# Patient Record
Sex: Female | Born: 1955
Health system: Southern US, Community
[De-identification: ages and names within clinical notes are randomized; demographics above are authoritative.]

## PROBLEM LIST (undated history)

## (undated) DIAGNOSIS — L509 Urticaria, unspecified: Secondary | ICD-10-CM

## (undated) DIAGNOSIS — K219 Gastro-esophageal reflux disease without esophagitis: Secondary | ICD-10-CM

## (undated) DIAGNOSIS — T783XXA Angioneurotic edema, initial encounter: Secondary | ICD-10-CM

## (undated) DIAGNOSIS — E7800 Pure hypercholesterolemia, unspecified: Secondary | ICD-10-CM

## (undated) DIAGNOSIS — E119 Type 2 diabetes mellitus without complications: Secondary | ICD-10-CM

## (undated) DIAGNOSIS — I10 Essential (primary) hypertension: Secondary | ICD-10-CM

## (undated) HISTORY — PX: TUBAL LIGATION: SHX77

## (undated) HISTORY — DX: Urticaria, unspecified: L50.9

## (undated) HISTORY — DX: Angioneurotic edema, initial encounter: T78.3XXA

## (undated) HISTORY — DX: Type 2 diabetes mellitus without complications: E11.9

## (undated) HISTORY — DX: Pure hypercholesterolemia, unspecified: E78.00

## (undated) HISTORY — DX: Gastro-esophageal reflux disease without esophagitis: K21.9

---

## 2015-05-17 ENCOUNTER — Encounter (INDEPENDENT_AMBULATORY_CARE_PROVIDER_SITE_OTHER): Payer: Self-pay | Admitting: *Deleted

## 2015-05-28 ENCOUNTER — Other Ambulatory Visit (INDEPENDENT_AMBULATORY_CARE_PROVIDER_SITE_OTHER): Payer: Self-pay | Admitting: Internal Medicine

## 2015-05-28 ENCOUNTER — Encounter (INDEPENDENT_AMBULATORY_CARE_PROVIDER_SITE_OTHER): Payer: Self-pay | Admitting: *Deleted

## 2015-05-28 ENCOUNTER — Ambulatory Visit (INDEPENDENT_AMBULATORY_CARE_PROVIDER_SITE_OTHER): Payer: BC Managed Care – PPO | Admitting: Internal Medicine

## 2015-05-28 ENCOUNTER — Encounter (INDEPENDENT_AMBULATORY_CARE_PROVIDER_SITE_OTHER): Payer: Self-pay | Admitting: Internal Medicine

## 2015-05-28 DIAGNOSIS — R1310 Dysphagia, unspecified: Secondary | ICD-10-CM

## 2015-05-28 DIAGNOSIS — E119 Type 2 diabetes mellitus without complications: Secondary | ICD-10-CM | POA: Insufficient documentation

## 2015-05-28 DIAGNOSIS — E7800 Pure hypercholesterolemia, unspecified: Secondary | ICD-10-CM | POA: Insufficient documentation

## 2015-05-28 NOTE — Progress Notes (Addendum)
   Subjective:    Patient ID: Julie Hamilton, female    DOB: [birth year], 59 y.o.   MRN: [id number]  HPI Referred by Dr. Pleas Koch for dysphagia. She tells me occasionally if she doesn't make a conscious effort to chew beef, it will lodge. Sometimes she has to vomit the bolus back up. If she chews it well enough, she doesn't have any issues. Chicken and beef bother her. She does have some acid reflux.  Acid reflux is worse at night. She ha taken Prilosec, Prevacid in the past. She does not take long-term. No abdominal pain. Usually has BM daily. No melena or BRRB. No family hx of colon cancer. Last colonoscopy 1 year ago by Dr Anthony Sar and was normal per patient.     Review of Systems Past Medical History  Diagnosis Date  . High cholesterol   . Diabetes ([place])     x 4 yrs prediabetic    Past Surgical History  Procedure Laterality Date  . Tubal ligation      Allergies  Allergen Reactions  . Penicillins     hives   Retired Agricultural engineer. No current outpatient prescriptions on file prior to visit.   No current facility-administered medications on file prior to visit.   Current Outpatient Prescriptions  Medication Sig Dispense Refill  . aspirin 81 MG tablet Take 81 mg by mouth daily.    . Calcium-Vitamin D (CALTRATE 600 PLUS-VIT D PO) Take by mouth.    . famotidine (PEPCID AC) 10 MG chewable tablet Chew 10 mg by mouth 2 (two) times daily.    Marland Kitchen ibuprofen (ADVIL,MOTRIN) 200 MG tablet Take 200 mg by mouth every 6 (six) hours as needed.    . Loratadine 10 MG CAPS Take by mouth every morning.    . lovastatin (MEVACOR) 20 MG tablet Take 20 mg by mouth at bedtime.    . metFORMIN (GLUCOPHAGE) 1000 MG tablet Take 1,000 mg by mouth 2 (two) times daily with a meal.    . Misc Natural Products (OSTEO BI-FLEX ADV DOUBLE ST PO) Take by mouth every morning.    . multivitamin-iron-minerals-folic acid (CENTRUM) chewable tablet Chew 1 tablet by mouth daily.     No current  facility-administered medications for this visit.         Objective:   Physical ExamBlood pressure 152/80, pulse 76, temperature 98.7 F (37.1 C), height 5' 5.5" (1.664 m), weight 177 lb 1.6 oz (80.332 kg). Alert and oriented. Skin warm and dry. Oral mucosa is moist.   . Sclera anicteric, conjunctivae is pink. Thyroid not enlarged. No cervical lymphadenopathy. Lungs clear. Heart regular rate and rhythm.  Abdomen is soft. Bowel sounds are positive. No hepatomegaly. No abdominal masses felt. No tenderness.  No edema to lower extremities.          Assessment & Plan:  EGD/ED. The risks and benefits such as perforation, bleeding, and infection were reviewed with the patient and is agreeable.

## 2015-05-28 NOTE — Patient Instructions (Addendum)
EGD/ED. The risks and benefits such as perforation, bleeding, and infection were reviewed with the patient and is agreeable.  Raise HOB 30 degrees. Take Pepcid 30 minutes before breakfast

## 2015-05-30 ENCOUNTER — Encounter (HOSPITAL_COMMUNITY): Payer: Self-pay | Admitting: *Deleted

## 2015-05-30 ENCOUNTER — Ambulatory Visit (HOSPITAL_COMMUNITY)
Admission: RE | Admit: 2015-05-30 | Discharge: 2015-05-30 | Disposition: A | Discharge status: Home / Self Care | Payer: BC Managed Care – PPO | Source: Ambulatory Visit | Attending: Internal Medicine | Admitting: Internal Medicine

## 2015-05-30 ENCOUNTER — Encounter (HOSPITAL_COMMUNITY)
Admission: RE | Disposition: A | Discharge status: Home / Self Care | Payer: Self-pay | Source: Ambulatory Visit | Attending: Internal Medicine

## 2015-05-30 DIAGNOSIS — Z7982 Long term (current) use of aspirin: Secondary | ICD-10-CM | POA: Insufficient documentation

## 2015-05-30 DIAGNOSIS — K210 Gastro-esophageal reflux disease with esophagitis: Secondary | ICD-10-CM | POA: Diagnosis not present

## 2015-05-30 DIAGNOSIS — K259 Gastric ulcer, unspecified as acute or chronic, without hemorrhage or perforation: Secondary | ICD-10-CM | POA: Diagnosis not present

## 2015-05-30 DIAGNOSIS — I10 Essential (primary) hypertension: Secondary | ICD-10-CM | POA: Diagnosis not present

## 2015-05-30 DIAGNOSIS — E119 Type 2 diabetes mellitus without complications: Secondary | ICD-10-CM | POA: Diagnosis not present

## 2015-05-30 DIAGNOSIS — Z7984 Long term (current) use of oral hypoglycemic drugs: Secondary | ICD-10-CM | POA: Diagnosis not present

## 2015-05-30 DIAGNOSIS — K449 Diaphragmatic hernia without obstruction or gangrene: Secondary | ICD-10-CM | POA: Diagnosis not present

## 2015-05-30 DIAGNOSIS — K222 Esophageal obstruction: Secondary | ICD-10-CM | POA: Diagnosis not present

## 2015-05-30 DIAGNOSIS — K255 Chronic or unspecified gastric ulcer with perforation: Secondary | ICD-10-CM | POA: Diagnosis not present

## 2015-05-30 DIAGNOSIS — D131 Benign neoplasm of stomach: Secondary | ICD-10-CM

## 2015-05-30 DIAGNOSIS — E7800 Pure hypercholesterolemia, unspecified: Secondary | ICD-10-CM | POA: Insufficient documentation

## 2015-05-30 DIAGNOSIS — K317 Polyp of stomach and duodenum: Secondary | ICD-10-CM | POA: Diagnosis not present

## 2015-05-30 DIAGNOSIS — R1310 Dysphagia, unspecified: Secondary | ICD-10-CM | POA: Insufficient documentation

## 2015-05-30 DIAGNOSIS — K2970 Gastritis, unspecified, without bleeding: Secondary | ICD-10-CM | POA: Insufficient documentation

## 2015-05-30 HISTORY — PX: ESOPHAGOGASTRODUODENOSCOPY: SHX5428

## 2015-05-30 HISTORY — DX: Essential (primary) hypertension: I10

## 2015-05-30 HISTORY — PX: ESOPHAGEAL DILATION: SHX303

## 2015-05-30 LAB — GLUCOSE, CAPILLARY: Glucose-Capillary: 93 mg/dL (ref 65–99)

## 2015-05-30 SURGERY — EGD (ESOPHAGOGASTRODUODENOSCOPY)
Anesthesia: Moderate Sedation

## 2015-05-30 MED ORDER — SODIUM CHLORIDE 0.9 % IV SOLN
INTRAVENOUS | Status: DC
  Administered 2015-05-30: 10:00:00 via INTRAVENOUS

## 2015-05-30 MED ORDER — BUTAMBEN-TETRACAINE-BENZOCAINE 2-2-14 % EX AERO
INHALATION_SPRAY | CUTANEOUS | Status: DC | PRN
  Administered 2015-05-30: 2 via TOPICAL

## 2015-05-30 MED ORDER — MIDAZOLAM HCL 5 MG/5ML IJ SOLN
INTRAMUSCULAR | Status: AC
  Filled 2015-05-30: qty 10

## 2015-05-30 MED ORDER — MEPERIDINE HCL 50 MG/ML IJ SOLN
INTRAMUSCULAR | Status: AC
  Filled 2015-05-30: qty 1

## 2015-05-30 MED ORDER — STERILE WATER FOR IRRIGATION IR SOLN
Status: DC | PRN
Start: 2015-05-30 — End: 2015-05-30
  Administered 2015-05-30: 11:00:00

## 2015-05-30 MED ORDER — MEPERIDINE HCL 50 MG/ML IJ SOLN
INTRAMUSCULAR | Status: DC | PRN
Start: 2015-05-30 — End: 2015-05-30
  Administered 2015-05-30 (×2): 25 mg via INTRAVENOUS

## 2015-05-30 MED ORDER — BUTAMBEN-TETRACAINE-BENZOCAINE 2-2-14 % EX AERO
INHALATION_SPRAY | CUTANEOUS | Status: AC
  Filled 2015-05-30: qty 20

## 2015-05-30 MED ORDER — PANTOPRAZOLE SODIUM 40 MG PO TBEC: 40.0000 mg | DELAYED_RELEASE_TABLET | Freq: Every day | ORAL | Status: DC

## 2015-05-30 MED ORDER — MIDAZOLAM HCL 5 MG/5ML IJ SOLN
INTRAMUSCULAR | Status: DC | PRN
  Administered 2015-05-30: 2 mg via INTRAVENOUS
  Administered 2015-05-30: 1 mg via INTRAVENOUS
  Administered 2015-05-30: 2 mg via INTRAVENOUS
  Administered 2015-05-30: 1 mg via INTRAVENOUS
  Administered 2015-05-30: 2 mg via INTRAVENOUS
  Administered 2015-05-30: 1 mg via INTRAVENOUS

## 2015-05-30 NOTE — H&P (Signed)
Julie Hamilton is an 59 y.o. female.   Chief Complaint: Patient is here for EGD and ED. HPI: Patient is 59 year old Caucasian female who presents with several month history of intermittent solid food dysphagia. She she points to suprasternal area upper miscellaneous site of bolus obstruction. She has intermittent heartburn but not every day. He uses OTC Pepcid usually twice a week. She denies nausea vomiting involuntary weight loss abdominal pain or melena. She's had a few episodes food impaction that she's been able to get relief by self-induced vomiting.  Past Medical History  Diagnosis Date  . High cholesterol   . Diabetes ([place])     x 4 yrs prediabetic  . Hypertension     Past Surgical History  Procedure Laterality Date  . Tubal ligation      History reviewed. No pertinent family history. Social History:  reports that she has never smoked. She does not have any smokeless tobacco history on file. She reports that she does not drink alcohol or use illicit drugs.  Allergies:  Allergies  Allergen Reactions  . Penicillins     hives    Medications Prior to Admission  Medication Sig Dispense Refill  . aspirin 81 MG tablet Take 81 mg by mouth daily.    . Calcium-Vitamin D (CALTRATE 600 PLUS-VIT D PO) Take by mouth.    . famotidine (PEPCID AC) 10 MG chewable tablet Chew 10 mg by mouth 2 (two) times daily.    Marland Kitchen ibuprofen (ADVIL,MOTRIN) 200 MG tablet Take 200 mg by mouth every 6 (six) hours as needed.    . Loratadine 10 MG CAPS Take by mouth every morning.    . lovastatin (MEVACOR) 20 MG tablet Take 20 mg by mouth at bedtime.    . metFORMIN (GLUCOPHAGE) 1000 MG tablet Take 1,000 mg by mouth 2 (two) times daily with a meal.    . Misc Natural Products (OSTEO BI-FLEX ADV DOUBLE ST PO) Take by mouth every morning.    . multivitamin-iron-minerals-folic acid (CENTRUM) chewable tablet Chew 1 tablet by mouth daily.      Results for orders placed or performed during the hospital encounter of  05/30/15 (from the past 48 hour(s))  Glucose, capillary     Status: None   Collection Time: 05/30/15 10:14 AM  Result Value Ref Range   Glucose-Capillary 93 65 - 99 mg/dL   No results found.  ROS  Blood pressure 151/102, pulse 92, temperature 97.8 F (36.6 C), temperature source Oral, resp. rate 17, height 5\' 5"  (1.651 m), weight 177 lb (80.287 kg), SpO2 99 %. Physical Exam  Constitutional: She appears well-developed and well-nourished.  HENT:  Mouth/Throat: Oropharynx is clear and moist.  Eyes: Conjunctivae are normal. No scleral icterus.  Neck: No thyromegaly present.  Cardiovascular: Normal rate, regular rhythm and normal heart sounds.   No murmur heard. Respiratory: Effort normal and breath sounds normal.  GI: Soft. She exhibits no distension and no mass. There is no tenderness.  Musculoskeletal: She exhibits no edema.  Lymphadenopathy:    She has no cervical adenopathy.  Neurological: She is alert.  Skin: Skin is warm and dry.     Assessment/Plan Solid food dysphagia. EGD with ED.  Jered Heiny U 05/30/2015, 10:29 AM

## 2015-05-30 NOTE — Discharge Instructions (Signed)
Resume aspirin on 06/02/2015. Keep ibuprofen use to minimum. Resume other medications and usual diet. No driving for 24 hours. Physician will call with biopsy results. Office visit in 3 months.  Esophagogastroduodenoscopy Esophagogastroduodenoscopy (EGD) is a procedure that is used to examine the lining of the esophagus, stomach, and first part of the small intestine (duodenum). A long, flexible, lighted tube with a camera attached (endoscope) is inserted down the throat to view these organs. This procedure is done to detect problems or abnormalities, such as inflammation, bleeding, ulcers, or growths, in order to treat them. The procedure lasts 5-20 minutes. It is usually an outpatient procedure, but it may need to be performed in a hospital in emergency cases. LET [facility] CARE PROVIDER KNOW ABOUT:  Any allergies you have.  All medicines you are taking, including vitamins, herbs, eye drops, creams, and over-the-counter medicines.  Previous problems you or members of your family have had with the use of anesthetics.  Any blood disorders you have.  Previous surgeries you have had.  Medical conditions you have. RISKS AND COMPLICATIONS Generally, this is a safe procedure. However, problems can occur and include:  Infection.  Bleeding.  Tearing (perforation) of the esophagus, stomach, or duodenum.  Difficulty breathing or not being able to breathe.  Excessive sweating.  Spasms of the larynx.  Slowed heartbeat.  Low blood pressure. BEFORE THE PROCEDURE  Do not eat or drink anything after midnight on the night before the procedure or as directed by your health care provider.  Do not take your regular medicines before the procedure if your health care provider asks you not to. Ask your health care provider about changing or stopping those medicines.  If you wear dentures, be prepared to remove them before the procedure.  Arrange for someone to drive you home after the  procedure. PROCEDURE  A numbing medicine (local anesthetic) may be sprayed in your throat for comfort and to stop you from gagging or coughing.  You will have an IV tube inserted in a vein in your hand or arm. You will receive medicines and fluids through this tube.  You will be given a medicine to relax you (sedative).  A pain reliever will be given through the IV tube.  A mouth guard may be placed in your mouth to protect your teeth and to keep you from biting on the endoscope.  You will be asked to lie on your left side.  The endoscope will be inserted down your throat and into your esophagus, stomach, and duodenum.  Air will be put through the endoscope to allow your health care provider to clearly view the lining of your esophagus.  The lining of your esophagus, stomach, and duodenum will be examined. During the exam, your health care provider may:  Remove tissue to be examined under a microscope (biopsy) for inflammation, infection, or other medical problems.  Remove growths.  Remove objects (foreign bodies) that are stuck.  Treat any bleeding with medicines or other devices that stop tissues from bleeding (hot cautery, clipping devices).  Widen (dilate) or stretch narrowed areas of your esophagus and stomach.  The endoscope will be withdrawn. AFTER THE PROCEDURE  You will be taken to a recovery area for observation. Your blood pressure, heart rate, breathing rate, and blood oxygen level will be monitored often until the medicines you were given have worn off.  Do not eat or drink anything until the numbing medicine has worn off and your gag reflex has returned. You may  choke.  Your health care provider should be able to discuss his or her findings with you. It will take longer to discuss the test results if any biopsies were taken.   This information is not intended to replace advice given to you by your health care provider. Make sure you discuss any questions you have  with your health care provider.   Document Released: 10/03/2004 Document Revised: 06/23/2014 Document Reviewed: 05/05/2012 Elsevier Interactive Patient Education [employer].

## 2015-05-30 NOTE — Op Note (Addendum)
EGD PROCEDURE REPORT  PATIENT:  Julie Hamilton  MR#:  [id number] Birthdate:  1955-11-30, 59 y.o., female Endoscopist:  Dr. Rogene Houston, MD Referred By:  Dr. [name], MD  Procedure Date: 05/30/2015  Procedure:   EGD with ED  Indications: Patient is 59 year old Caucasian female who presents with several month history of intermittent solid food dysphagia. She has had few episodes of food impaction relief with self-induced vomiting. She has history of intermittent heartburn and presently on H2B on as-needed basis per            Informed Consent:  The risks, benefits, alternatives & imponderables which include, but are not limited to, bleeding, infection, perforation, drug reaction and potential missed lesion have been reviewed.  The potential for biopsy, lesion removal, esophageal dilation, etc. have also been discussed.  Questions have been answered.  All parties agreeable.  Please see history & physical in medical record for more information.  Medications:  Demerol 50 mg IV Versed 9 mg IV Cetacaine spray topically for oropharyngeal anesthesia  Description of procedure:  The endoscope was introduced through the mouth and advanced to the second portion of the duodenum without difficulty or limitations. The mucosal surfaces were surveyed very carefully during advancement of the scope and upon withdrawal.  Findings:  Esophagus:  Mucosa of the esophagus was normal. 2 erosions noted at GE junction along with stricture. Gastric mucosa at GE junction was erythematous and edematous. GEJ:  35 cm Hiatus:  38 cm Stomach:  Stomach was empty and distended very well with insufflation. Folds in the proximal stomach were normal. Examination of mucosa at gastric body revealed 5 mm ulcer along the posterior wall covered with eschar or coffee-ground material. No active bleeding noted. Antral mucosa was normal. Pyloric channel was patent. 5 mm hyperplastic appearing polyps noted just below the level of  hiatus also seen on forward view. Duodenum:  Patchy bulbar erythema and edema. Post bulbar mucosa was normal.  Therapeutic/Diagnostic Maneuvers Performed:   Esophagus dilated by passing 36 French Maloney dilator to full insertion. Esophageal mucosa was reexamined post dilation and mucosal disruption noted at GE junction. Three biopsies were taken from gastric ulcer and mucosa around it.  Complications:  None  EBL: Minimal  Impression: Erosive esophagitis along with stricture at GE junction. Small sliding hiatal hernia. 5 mm ulcer at gastric body along the posterior wall. Biopsies taken. 5 mm hyperplastic appearing polyp just below the level of hiatus. This polyp was left alone. Esophagus dilated by passing 54 French Maloney dilator resulting in mucosal disruption at [employer].  Recommendations:  Standard instructions given. Patient will resume aspirin in 06/02/2015. Discontinue famotidine. Pantoprazole 40 mg by mouth every morning. I will be contacting patient with biopsy results. Office visit in 3 months.  Julie Hamilton  05/30/2015  11:01 AM  CC: Dr. [name], MD & Dr. Rayne Du ref. provider found

## 2015-06-04 ENCOUNTER — Encounter (HOSPITAL_COMMUNITY): Payer: Self-pay | Admitting: Internal Medicine

## 2015-08-28 ENCOUNTER — Encounter (INDEPENDENT_AMBULATORY_CARE_PROVIDER_SITE_OTHER): Payer: Self-pay | Admitting: Internal Medicine

## 2015-08-28 ENCOUNTER — Ambulatory Visit (INDEPENDENT_AMBULATORY_CARE_PROVIDER_SITE_OTHER): Payer: BC Managed Care – PPO | Admitting: Internal Medicine

## 2015-08-28 VITALS — BP 142/80 | HR 72 | Temp 97.9°F | Ht 65.5 in | Wt 178.4 lb

## 2015-08-28 DIAGNOSIS — R1319 Other dysphagia: Secondary | ICD-10-CM

## 2015-08-28 DIAGNOSIS — K219 Gastro-esophageal reflux disease without esophagitis: Secondary | ICD-10-CM | POA: Diagnosis not present

## 2015-08-28 MED ORDER — PANTOPRAZOLE SODIUM 40 MG PO TBEC: 40.0000 mg | DELAYED_RELEASE_TABLET | Freq: Every day | ORAL | Status: DC

## 2015-08-28 NOTE — Progress Notes (Signed)
Subjective:    Patient ID: Julie Hamilton, female    DOB: [birth year], 60 y.o.   MRN: [id number]  HPIHere today for f/u after EGD/ED in December for intermittent solid food dysphagia. She tells me she is doing good. GERD controlled with Protonix. No dysphagia. Underwent an EGD/ED in December. Appetite is good. No weight loss.  Has a BM daily. No melena or BRRB.   05/30/2015 EGD/ED:  Indications: Patient is 60 year old Caucasian female who presents with several month history of intermittent solid food dysphagia. She has had few episodes of food impaction relief with self-induced vomiting. She has history of intermittent heartburn and presently on H2B on as-needed basis per Impression: Erosive esophagitis along with stricture at GE junction. Small sliding hiatal hernia. 5 mm ulcer at gastric body along the posterior wall. Biopsies taken. 5 mm hyperplastic appearing polyp just below the level of hiatus. This polyp was left alone. Esophagus dilated by passing 54 French Maloney dilator resulting in mucosal disruption at [employer].  Gastric biopsy shows gastritis but no ulcer and no evidence of H. Pylori. Patient is able to swallow much better and not having heartburn anymore.  Review of Systems Past Medical History  Diagnosis Date  . High cholesterol   . Diabetes ([place])     x 4 yrs prediabetic  . Hypertension   . GERD (gastroesophageal reflux disease)     Past Surgical History  Procedure Laterality Date  . Tubal ligation    . Esophagogastroduodenoscopy N/A 05/30/2015    Procedure: ESOPHAGOGASTRODUODENOSCOPY (EGD);  Surgeon: Rogene Houston, MD;  Location: [facility];  Service: Endoscopy;  Laterality: N/A;  10:30  . Esophageal dilation Bilateral 05/30/2015    Procedure: ESOPHAGEAL DILATION;  Surgeon: Rogene Houston, MD;  Location: [facility];  Service: Endoscopy;   Laterality: Bilateral;    Allergies  Allergen Reactions  . Penicillins     hives    Current Outpatient Prescriptions on File Prior to Visit  Medication Sig Dispense Refill  . aspirin 81 MG tablet Take 81 mg by mouth. One every other day    . Calcium-Vitamin D (CALTRATE 600 PLUS-VIT D PO) Take by mouth.    Marland Kitchen ibuprofen (ADVIL,MOTRIN) 200 MG tablet Take 200 mg by mouth every 6 (six) hours as needed.    . Loratadine 10 MG CAPS Take by mouth every morning.    . lovastatin (MEVACOR) 20 MG tablet Take 20 mg by mouth at bedtime.    . metFORMIN (GLUCOPHAGE) 1000 MG tablet Take 500 mg by mouth daily with breakfast.     . Misc Natural Products (OSTEO BI-FLEX ADV DOUBLE ST PO) Take by mouth every morning.    . multivitamin-iron-minerals-folic acid (CENTRUM) chewable tablet Chew 1 tablet by mouth daily.     No current facility-administered medications on file prior to visit.        Objective:   Physical ExamBlood pressure 142/80, pulse 72, temperature 97.9 F (36.6 C), height 5' 5.5" (1.664 m), weight 178 lb 6.4 oz (80.922 kg).  Alert and oriented. Skin warm and dry. Oral mucosa is moist.   . Sclera anicteric, conjunctivae is pink. Thyroid not enlarged. No cervical lymphadenopathy. Lungs clear. Heart regular rate and rhythm.  Abdomen is soft. Bowel sounds are positive. No hepatomegaly. No abdominal masses felt. No tenderness.  No edema to lower extremities.         Assessment & Plan:  GERD controlled with Protonix. Continue. Refill sent to her pharmacy. Dysphagia: No problems swallowing.  OV in 1 year.

## 2015-08-28 NOTE — Patient Instructions (Signed)
OV in 1 year. Continue the Protonix 

## 2016-01-22 ENCOUNTER — Encounter: Payer: Self-pay | Admitting: Allergy & Immunology

## 2016-01-22 ENCOUNTER — Ambulatory Visit (INDEPENDENT_AMBULATORY_CARE_PROVIDER_SITE_OTHER): Payer: BC Managed Care – PPO | Admitting: Allergy & Immunology

## 2016-01-22 VITALS — BP 126/70 | HR 99 | Temp 97.4°F | Resp 16 | Ht 64.17 in | Wt 178.6 lb

## 2016-01-22 DIAGNOSIS — L500 Allergic urticaria: Secondary | ICD-10-CM

## 2016-01-22 DIAGNOSIS — L508 Other urticaria: Secondary | ICD-10-CM | POA: Insufficient documentation

## 2016-01-22 NOTE — Patient Instructions (Addendum)
1. Chronic urticaria - Testing today showed: positive to milk, casein, cashew, strawberry and almond  - Try taking each food out for 2-4 weeks at a time to see if they get better.  - We will get some lab work to rule out more serious causes of hives: complete blood count, tryptase, ESR (inflammatory marker), CRP (inflammatory marker), chronic urticaria panel (to look for auto-antibodies against mast cells which can release hive-causing chemicals). - Start cetirizine 68m in the morning and 243mat night. You can increase as needed at home to control the hives up to 3068mwice daily. - If this is not working, give us Koreacall!  - We can move to other classes of medications if this is not working.  2. Return to clinic in two months.  It was a pleasure to meet you today!

## 2016-01-22 NOTE — Progress Notes (Signed)
NEW PATIENT  Date of Service/Encounter:  01/22/16   Assessment:   Chronic urticaria - Plan: CP Chronic Urticaria index panel, C-reactive protein, Sedimentation rate, Tryptase, CBC with Differential, Allergy Test    Plan/Recommendations:    1. Chronic urticaria - Testing today showed: positive to milk, casein, cashew, strawberry and almond  - We did discuss the above food allergy testing, including its limitations. There is a high false positive rate. Therefore the positives that we got on testing today may not be valid. Therefore we will only remove the foods for a limited time.  - She was not having anaphylaxis symptoms to the foods, therefore no epinephrine prescribed.  - Try taking each food out for 2-4 weeks at a time to see if the hives improve.  - If there is no improvement, add the foods back to your diet.  - We will get some lab work to rule out more serious causes of hives: complete blood count, tryptase, ESR (inflammatory marker), CRP (inflammatory marker), chronic urticaria panel (to look for auto-antibodies against mast cells which can release hive-causing chemicals). - Start cetirizine 69m in the morning and 293mat night. You can increase as needed at home to control the hives up to 3055mwice daily. - If this is not working, give us Koreacall!  - We can move to other classes of medications if this is not working.  2. Return to clinic in two months.      Subjective:   Julie Hamilton a 60 3o. female presenting today for evaluation of  Chief Complaint  Patient presents with  . Asthma  .  Julie Nixs a history of the following: Patient Active Problem List   Diagnosis Date Noted  . Diabetes ([place]/05/2015  . High cholesterol 05/28/2015  . Dysphagia 05/28/2015    History obtained from: chart review and patient.  Julie Hamilton referred by BURCurlene LabrumD.     Julie Hamilton a 60 53o. female presenting for hives. Patient reports that  these have been going on for several months. Patient reports that the worst episode occurs on June 28th. She started working at [employer]. 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 Then she noticed that she was tired. Shortly after waking up from a nap, she reports that she had "firey" hands that were pruritic. Symptoms continued for three days. She treated with antihistamines and ointments. She had eaten peanut butter earlier in the day. She had no other systemic symptoms, including wheezing, abdominal pain, throat closure, etc since that time. Since starting at the volunteering gig, she has had small bug bites as well.   Since that time, she has been keeping a food diary. Cashews may be a trigger. She also had shrimp earlier this year in January or February. She ate shrimp twice in one day and this resulted in difficulty breathing and talking. She did not have hives, stomach pain, vomiting, etc. She endorses swelling of her lip or blisters on her mouth. The lesions tend to resolve to her normal skin. She has had no fevers or joint She has not had blood work done since the symptoms started but next Tuesday she is going in for lab work for her PCP. She did change her laundry soap yesterday but otherwise no changes.   The urticaria tend to occur on her back around her bra straps,  under arms, and on her thighs. Another trigger is when she has pressure against her skin (bra, bags of groceries). However, this is not always the case. She has noticed a worsening of her symptoms with exposure to warm water, but water in general does not make them appear. Sunshine and vibrations are not a trigger either. She is on loratadine which does relieve some of the itching. She also takes a generic diphenhydramine. She has never taken any other antihistamine. She has never had any systemic reactions during these outbreaks,  including abdominal pain, wheezing, throat swelling, vomiting, or passing out.   Julie Hamilton does have a history of allergic rhinitis and was tested at our office in 2005. Testing at the time demonstrated positives to grasses, weeds, trees and equivocal results to dust mites. She also had food testing that showed equivocal results to shellfish, fish, and cashew. However she did not avoid any foods following that visit in 2005 and never followed up again   Julie Hamilton had hypertension and was on blood pressure medications until recently. She also has a history of diabetes. Otherwise, there is no history of other atopic diseases, including asthma, drug allergies, or inging insect allergies. There is no significant infectious history. Vaccinations are up to date.    Past Medical History: Patient Active Problem List   Diagnosis Date Noted  . Diabetes ([place]) 05/28/2015  . High cholesterol 05/28/2015  . Dysphagia 05/28/2015    Medication List:    Medication List       Accurate as of 01/22/16  5:01 PM. Always use your most recent med list.          aspirin 81 MG tablet Take 81 mg by mouth. One every other day   CALTRATE 600 PLUS-VIT D PO Take by mouth.   ibuprofen 200 MG tablet Commonly known as:  ADVIL,MOTRIN Take 200 mg by mouth every 6 (six) hours as needed.   Loratadine 10 MG Caps Take by mouth every morning.   lovastatin 20 MG tablet Commonly known as:  MEVACOR Take 20 mg by mouth at bedtime.   metFORMIN 500 MG tablet Commonly known as:  GLUCOPHAGE   multivitamin-iron-minerals-folic acid chewable tablet Chew 1 tablet by mouth daily.   OSTEO BI-FLEX ADV DOUBLE ST PO Take by mouth every morning.   pantoprazole 40 MG tablet Commonly known as:  PROTONIX Take 1 tablet (40 mg total) by mouth daily.       Birth History: non-contributory  Developmental History: Julie Hamilton has met all milestones on time.   Past Surgical History: Past Surgical History:  Procedure Laterality Date   . ESOPHAGEAL DILATION Bilateral 05/30/2015   Procedure: ESOPHAGEAL DILATION;  Surgeon: Rogene Houston, MD;  [facility];  Service: Endoscopy;  Laterality: Bilateral;  . ESOPHAGOGASTRODUODENOSCOPY N/A 05/30/2015   Procedure: ESOPHAGOGASTRODUODENOSCOPY (EGD);  Surgeon: Rogene Houston, MD;  [facility];  Service: Endoscopy;  Laterality: N/A;  10:30  . TUBAL LIGATION       Family History: Family History  Problem Relation Age of Onset  . 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 There are no pets at home and no smoke. She lives with her husband and three grandchildren and your daughter-in-law.    Review of Systems: a 14-point review of systems is pertinent for what is mentioned in HPI.  Otherwise, all other systems were negative. Constitutional: negative other  than that listed in the HPI Eyes: negative other than that listed in the HPI Ears, nose, mouth, throat, and face: negative other than that listed in the HPI Respiratory: negative other than that listed in the HPI Cardiovascular: negative other than that listed in the HPI Gastrointestinal: negative other than that listed in the HPI Genitourinary: negative other than that listed in the HPI Integument: negative other than that listed in the HPI Hematologic: negative other than that listed in the HPI Musculoskeletal: negative other than that listed in the HPI Neurological: negative other than that listed in the HPI Allergy/Immunologic: negative other than that listed in the HPI    Objective:   Blood pressure 126/70, pulse 99, temperature 97.4 F (36.3 C), temperature source Oral, resp. rate 16, height 5' 4.17" (1.63 m), weight 178 lb 9.6 oz (81 kg), SpO2 98 %. Body mass index is 30.49 kg/m.  Physical Exam:  General: Alert, interactive, in no acute distress. Talkative female. Cooperative with exam. HEENT: TMs pearly  gray, turbinates edematous with clear discharge, post-pharynx mildly erythematous. Neck: Supple without lymphadenopathy. Lungs: Clear to auscultation without wheezing, rhonchi or rales. No crackles. Moving air well in all lung fields. Increased work of breathing. CV: Normal S1, S2 without murmurs. Capillary refill within normal limits. Abdomen: Nondistended, nontender. No masses. Skin:Urticarial lesions noted on her abdomen and inner thigh. These lesions are blanchable.. Extremities: No clubbing, cyanosis or edema. Neuro: Grossly intact. No focal deficits.  Diagnostic studies:  Allergy testing to selected foods: Positive to milk, casein, cashew, strawberry, almond with adequate controls. Overall, she was dermatographic.   Salvatore Marvel, MD [facility]

## 2016-02-25 ENCOUNTER — Encounter (INDEPENDENT_AMBULATORY_CARE_PROVIDER_SITE_OTHER): Payer: Self-pay

## 2016-04-01 ENCOUNTER — Encounter: Payer: Self-pay | Admitting: Allergy & Immunology

## 2016-04-01 ENCOUNTER — Ambulatory Visit (INDEPENDENT_AMBULATORY_CARE_PROVIDER_SITE_OTHER): Payer: BC Managed Care – PPO | Admitting: Allergy & Immunology

## 2016-04-01 VITALS — BP 120/70 | HR 82 | Temp 97.6°F | Resp 16

## 2016-04-01 DIAGNOSIS — T781XXD Other adverse food reactions, not elsewhere classified, subsequent encounter: Secondary | ICD-10-CM | POA: Diagnosis not present

## 2016-04-01 DIAGNOSIS — L500 Allergic urticaria: Secondary | ICD-10-CM | POA: Diagnosis not present

## 2016-04-01 NOTE — Patient Instructions (Addendum)
1. Allergic urticaria with multiple food allergies - Continue to avoid milk, casein, cashew, strawberry and almond  - Continue with suppressive dosing of cetirizine: 10mg  twice daily (can decrease as tolerated if you would like). - If you are having too much sleepiness, you can change to Allegra (fexofenadine) - Watch for protein amounts in your coconut milk and any other milk substitutes.  - I will look into the relationship between strawberry and raspberry and get back in touch with you.  2. Return to clinic in nine months.  It was a pleasure to see you again today! Thank you for everything that you do!   Websites that have reliable patient information: 1. American Academy of Asthma, Allergy, and Immunology: www.aaaai.org 2. Food Allergy Research and Education (FARE): foodallergy.org 3. Mothers of Asthmatics: http://www.asthmacommunitynetwork.org 4. American College of Allergy, Asthma, and Immunology: www.acaai.org

## 2016-04-01 NOTE — Progress Notes (Signed)
FOLLOW UP  Date of Service/Encounter:  04/01/16   Assessment:   Allergic urticaria  Adverse food reaction, subsequent encounter    Plan/Recommendations:   1. Allergic urticaria with multiple food allergies  - Continue to avoid milk, casein, cashew, strawberry and almond  - Continue with suppressive dosing of cetirizine: 10mg  twice daily (can decrease as tolerated if you would like). - If you are having too much sleepiness, you can change to Allegra (fexofenadine) - Watch for protein amounts in your coconut milk and any other milk substitutes.  - I did look into the relationship between strawberries and raspberries, and in fact there is no cross reactivity. - There is some potential cross reactivity between strawberries, grapes, and cherries; however she should continue to eat grapes and cherries if she is doing fine with them.   2. Return to clinic in nine months.   Subjective:   Julie Hamilton is a 60 y.o. female presenting today for follow up of  Chief Complaint  Patient presents with  . Urticaria  .  Julie Hamilton has a history of the following: Patient Active Problem List   Diagnosis Date Noted  . Chronic urticaria 01/22/2016  . Diabetes ([place]) 05/28/2015  . High cholesterol 05/28/2015  . Dysphagia 05/28/2015    History obtained from: chart review and patient.  Julie Hamilton was referred by Julie Labrum, MD.     Julie Hamilton is a 60 y.o. female presenting for a follow up visit. Julie Hamilton was last seen in August 2017 for her first visit. At that time, she did have a workup for urticaria including food testing that was positive to milk, casein, cashew, strawberry, and almond. She was not having anaphylactic symptoms to the foods, therefore no EpiPen was prescribed. I recommended that she remove those foods from her diet to see if the urticaria improved. We ordered a lab workup that never collected from what I can tell. I put her on high dose antihistamine (20mg   cetirizine BID).  Since the last visit, she has done well. She is on cetirizine 10mg  BID and her hives are well controlled on this regimen. She has her diary with her today with symptoms and foods. She does not think that she has had hives for around three weeks. She continues to avoid milk, strawberries, and almonds and cashews. She is using coconut milk as her substitute. She is using dark chocolate as well as sorbet for her treats. She is wondering today whether raspberries and strawberries are related since she had raspberry sorbet with a minor breakout shortly after seeing me last time. She is continuing to avoid only almonds and cashew but eats the other tree nuts without a problem. She has had no systemic reactions necessitating the use of epinephrine. Interestingly, her GI complaints (crampy abdominal pain and diarrhea) have resolved since stopping cow's milk. Prior to this, she felt that her symptoms were related to either metformin or Protonix. However she has continued to take these medications while adjusting to her new dietary restrictions.    Otherwise, there have been no changes to her past medical history, surgical history, family history, or social history. 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     Review of Systems: a 14-point review of systems is pertinent for what is mentioned in HPI.  Otherwise, all other systems were negative. Constitutional: negative other than that listed in the HPI Eyes: negative other than that listed in the  HPI Ears, nose, mouth, throat, and face: negative other than that listed in the HPI Respiratory: negative other than that listed in the HPI Cardiovascular: negative other than that listed in the HPI Gastrointestinal: negative other than that listed in the HPI Genitourinary: negative other than that listed in the HPI Integument: negative other than that listed in the HPI Hematologic: negative other than  that listed in the HPI Musculoskeletal: negative other than that listed in the HPI Neurological: negative other than that listed in the HPI Allergy/Immunologic: negative other than that listed in the HPI    Objective:   Blood pressure 120/70, pulse 82, temperature 97.6 F (36.4 C), temperature source Oral, resp. rate 16, SpO2 97 %. There is no height or weight on file to calculate BMI.   Physical Exam:  General: Alert, interactive, in no acute distress. Cooperative with the exam. Very pleasant and thankful female. HEENT: TMs pearly gray, turbinates edematous without discharge, post-pharynx mildly erythematous. Neck: Supple without thyromegaly. Adenopathy: no enlarged lymph nodes appreciated in the anterior cervical, occipital, axillary, epitrochlear, inguinal, or popliteal regions Lungs: Clear to auscultation without wheezing, rhonchi or rales. No increased work of breathing. CV: Normal S1/S2, no murmurs. Capillary refill <2 seconds.  Abdomen: Nondistended, nontender. No guarding or rebound tenderness. Bowel sounds present in all fields  Skin: Warm and dry, without lesions or rashes. Extremities:  No clubbing, cyanosis or edema. Neuro:   Grossly intact.  Diagnostic studies: None    Julie Marvel, MD [facility]

## 2016-06-25 ENCOUNTER — Encounter (INDEPENDENT_AMBULATORY_CARE_PROVIDER_SITE_OTHER): Payer: Self-pay

## 2016-06-25 ENCOUNTER — Encounter (INDEPENDENT_AMBULATORY_CARE_PROVIDER_SITE_OTHER): Payer: Self-pay | Admitting: Internal Medicine

## 2016-08-27 ENCOUNTER — Ambulatory Visit (INDEPENDENT_AMBULATORY_CARE_PROVIDER_SITE_OTHER): Payer: BC Managed Care – PPO | Admitting: Internal Medicine

## 2016-08-28 ENCOUNTER — Ambulatory Visit (INDEPENDENT_AMBULATORY_CARE_PROVIDER_SITE_OTHER): Payer: BC Managed Care – PPO | Admitting: Internal Medicine

## 2016-09-01 ENCOUNTER — Encounter (INDEPENDENT_AMBULATORY_CARE_PROVIDER_SITE_OTHER): Payer: Self-pay

## 2016-09-01 ENCOUNTER — Ambulatory Visit (INDEPENDENT_AMBULATORY_CARE_PROVIDER_SITE_OTHER): Payer: BC Managed Care – PPO | Admitting: Internal Medicine

## 2016-09-01 ENCOUNTER — Encounter (INDEPENDENT_AMBULATORY_CARE_PROVIDER_SITE_OTHER): Payer: Self-pay | Admitting: Internal Medicine

## 2016-09-01 VITALS — BP 142/80 | HR 80 | Temp 98.2°F | Ht 65.0 in | Wt 177.6 lb

## 2016-09-01 DIAGNOSIS — K219 Gastro-esophageal reflux disease without esophagitis: Secondary | ICD-10-CM | POA: Diagnosis not present

## 2016-09-01 NOTE — Progress Notes (Signed)
   Subjective:    Patient ID: Julie Hamilton, female    DOB: Mar 08, 1956, 61 y.o.   MRN: [id number]  HPIHere today for f/u. She was last seen 08/2015 for GERD. Underwent and EGD/ED 05/30/2015 for dysphagia. Impression: Erosive esophagitis along with stricture at GE junction. Small sliding hiatal hernia. 5 mm ulcer at gastric body along the posterior wall. Biopsies taken. 5 mm hyperplastic appearing polyp just below the level of hiatus. This polyp was left alone. Esophagus dilated by passing 54 French Maloney dilator resulting in mucosal disruption at [employer]. Gastric biopsy shows gastritis but no ulcer and no evidence of H. Pylori. Patient is able to swallow much better and not having heartburn anymore. She says her acid reflux is good. Protonix controls her symptoms. She rarely has dysphagia. Sometimes if sh eats a large piece.  She has a BM at least once a day and sometimes twice a day. No melena or BRRB.    Review of Systems Past Medical History:  Diagnosis Date  . Diabetes ([place])    x 4 yrs prediabetic  . GERD (gastroesophageal reflux disease)   . High cholesterol   . Hypertension     Past Surgical History:  Procedure Laterality Date  . ESOPHAGEAL DILATION Bilateral 05/30/2015   Procedure: ESOPHAGEAL DILATION;  Surgeon: Rogene Houston, MD;  [facility];  Service: Endoscopy;  Laterality: Bilateral;  . ESOPHAGOGASTRODUODENOSCOPY N/A 05/30/2015   Procedure: ESOPHAGOGASTRODUODENOSCOPY (EGD);  Surgeon: Rogene Houston, MD;  [facility];  Service: Endoscopy;  Laterality: N/A;  10:30  . TUBAL LIGATION      Allergies  Allergen Reactions  . Penicillins     hives    Current Outpatient Prescriptions on File Prior to Visit  Medication Sig Dispense Refill  . Calcium-Vitamin D (CALTRATE 600 PLUS-VIT D PO) Take by mouth.    Marland Kitchen ibuprofen (ADVIL,MOTRIN) 200 MG tablet Take 200 mg by mouth every 6 (six) hours as needed.    . lovastatin (MEVACOR) 20 MG  tablet Take 20 mg by mouth at bedtime.    . metFORMIN (GLUCOPHAGE) 500 MG tablet 500 mg 2 (two) times daily with a meal.     . Misc Natural Products (OSTEO BI-FLEX ADV DOUBLE ST PO) Take by mouth every morning.    . multivitamin-iron-minerals-folic acid (CENTRUM) chewable tablet Chew 1 tablet by mouth daily.    . pantoprazole (PROTONIX) 40 MG tablet Take 1 tablet (40 mg total) by mouth daily. 90 tablet 3   No current facility-administered medications on file prior to visit.        Objective:   Physical Exam Blood pressure (!) 142/80, pulse 80, temperature 98.2 F (36.8 C), height 5\' 5"  (1.651 m), weight 177 lb 9.6 oz (80.6 kg). Alert and oriented. Skin warm and dry. Oral mucosa is moist.   . Sclera anicteric, conjunctivae is pink. Thyroid not enlarged. No cervical lymphadenopathy. Lungs clear. Heart regular rate and rhythm.  Abdomen is soft. Bowel sounds are positive. No hepatomegaly. No abdominal masses felt. No tenderness.  No edema to lower extremities       Assessment & Plan:  GERD. Continue the Protonix. OV in 1 year

## 2016-09-01 NOTE — Patient Instructions (Signed)
OV in 1 year.  

## 2016-11-17 ENCOUNTER — Other Ambulatory Visit: Payer: Self-pay | Admitting: Neurosurgery

## 2016-11-17 DIAGNOSIS — M5106 Intervertebral disc disorders with myelopathy, lumbar region: Secondary | ICD-10-CM

## 2016-11-21 ENCOUNTER — Ambulatory Visit
Admission: RE | Admit: 2016-11-21 | Discharge: 2016-11-21 | Disposition: A | Discharge status: Home / Self Care | Payer: BC Managed Care – PPO | Source: Ambulatory Visit | Attending: Neurosurgery | Admitting: Neurosurgery

## 2016-11-21 DIAGNOSIS — M5106 Intervertebral disc disorders with myelopathy, lumbar region: Secondary | ICD-10-CM

## 2016-11-21 MED ORDER — IOPAMIDOL (ISOVUE-M 200) INJECTION 41%
1.0000 mL | Freq: Once | INTRAMUSCULAR | Status: AC
  Administered 2016-11-21: 1 mL via EPIDURAL

## 2016-11-21 MED ORDER — METHYLPREDNISOLONE ACETATE 40 MG/ML INJ SUSP (RADIOLOG
120.0000 mg | Freq: Once | INTRAMUSCULAR | Status: AC
  Administered 2016-11-21: 120 mg via EPIDURAL

## 2016-11-21 NOTE — Discharge Instructions (Signed)
Post Procedure Spinal Discharge Instruction Sheet ° °1. You may resume a regular diet and any medications that you routinely take (including pain medications). ° °2. No driving day of procedure. ° °3. Light activity throughout the rest of the day.  Do not do any strenuous work, exercise, bending or lifting.  The day following the procedure, you can resume normal physical activity but you should refrain from exercising or physical therapy for at least three days thereafter. ° ° °Common Side Effects: ° °· Restlessness or inability to sleep- you may have trouble sleeping for the next few days.  Ask your referring physician if you need any medication for sleep. ° °· Facial flushing or redness- should subside within a few days. ° °· Increased pain- a temporary increase in pain a day or two following your procedure is not unusual.  Take your pain medication as prescribed by your referring physician. ° °· Leg cramps ° °Please contact our office at [phone] for the following symptoms: °· Fever greater than 100 degrees. °· Headaches unresolved with medication after 2-3 days. °· Increased swelling, pain, or redness at injection site. ° °Thank you for visiting our office. °

## 2016-11-26 ENCOUNTER — Other Ambulatory Visit (INDEPENDENT_AMBULATORY_CARE_PROVIDER_SITE_OTHER): Payer: Self-pay | Admitting: Internal Medicine

## 2016-12-23 ENCOUNTER — Ambulatory Visit: Payer: BC Managed Care – PPO | Admitting: Allergy & Immunology

## 2016-12-31 ENCOUNTER — Other Ambulatory Visit: Payer: Self-pay | Admitting: Nurse Practitioner

## 2016-12-31 DIAGNOSIS — M5106 Intervertebral disc disorders with myelopathy, lumbar region: Secondary | ICD-10-CM

## 2017-01-06 ENCOUNTER — Ambulatory Visit (INDEPENDENT_AMBULATORY_CARE_PROVIDER_SITE_OTHER): Payer: BC Managed Care – PPO | Admitting: Allergy & Immunology

## 2017-01-06 ENCOUNTER — Encounter (INDEPENDENT_AMBULATORY_CARE_PROVIDER_SITE_OTHER): Payer: Self-pay

## 2017-01-06 ENCOUNTER — Encounter: Payer: Self-pay | Admitting: Allergy & Immunology

## 2017-01-06 VITALS — BP 132/80 | HR 85 | Temp 97.6°F | Resp 18 | Ht 64.57 in | Wt 179.6 lb

## 2017-01-06 DIAGNOSIS — T781XXD Other adverse food reactions, not elsewhere classified, subsequent encounter: Secondary | ICD-10-CM | POA: Diagnosis not present

## 2017-01-06 DIAGNOSIS — L500 Allergic urticaria: Secondary | ICD-10-CM

## 2017-01-06 DIAGNOSIS — T781XXA Other adverse food reactions, not elsewhere classified, initial encounter: Secondary | ICD-10-CM | POA: Insufficient documentation

## 2017-01-06 NOTE — Progress Notes (Signed)
FOLLOW UP  Date of Service/Encounter:  01/06/17   Assessment:   Allergic urticaria  Adverse food reaction (milk, casein, cashew, strawberry and almond)  Perennial and seasonal allergic rhinitis (grasses, weeds, trees and dust mites)   Plan/Recommendations:   1. Allergic urticaria with multiple food allergies - Continue to avoid milk, casein, cashew, strawberry and almond. - I would consider introducing baked milk into your diet (cookies, cakes, etc).  - I anticipate that since you have tolerated hush puppies, then you should be fine with the baked milk. - Cow's milk (raw or otherwise) should still be avoided.  - Coconut and rice milks do not have protein.  - Soy milk is a fine alternative, as is Ripple milk (a pea-based protein).  - Continue with suppressive dosing of cetirizine: 10mg  twice daily (can decrease as tolerated if you would like). - EpiPen is up to date.   2. Return in about 1 year (around 01/06/2018).    Subjective:   Julie Hamilton is a 61 y.o. female presenting today for follow up of  Chief Complaint  Patient presents with  . Allergies    food    Julie Hamilton has a history of the following: Patient Active Problem List   Diagnosis Date Noted  . Chronic urticaria 01/22/2016  . Diabetes ([place]) 05/28/2015  . High cholesterol 05/28/2015  . Dysphagia 05/28/2015    History obtained from: chart review and patient.  Julie Hamilton was referred by [name], MD.     Julie Hamilton is a 61 y.o. female presenting for a follow up visit. She was last seen in October 2017. At that time, recommended continued avoidance of milk, casein, cashew, strawberry, and almond. We recommended continued cetirizine 10 mg twice daily. Julie Hamilton does have a history of allergic rhinitis and was tested at our office in 2005. Testing at the time demonstrated positives to grasses, weeds, trees and equivocal results to dust mites. She also had food testing that showed equivocal  results to shellfish, fish, and cashew. However she did not avoid any foods following that visit in 2005 and never followed up again.   Since the last visit, she has mostly done well. She did have a reaction when she ate chicken salad when she was on a vacation. She immediately developed throat swelling that improved with benadryl. She did talk to the restaurant and they did not know what they made the chicken salad out of. She has previously tolerated chicken salad. Thankfully, this is the only serious reaction that she has had since the last visit. Previously she had mostly had only urticaria without evidence of anaphylaxis. This throat swelling is new.   She continues to avoid milk in all forms. She drinks soy milk. She is avoiding cashews and almonds. She does eat peanuts, walnuts, as well as pecans. She is not eating strawberries, but is eating other berries and fruits without a problem.   Otherwise, there have been no changes to her past medical history, surgical history, family history, or social history. 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 She does have flares on her skin at the food pantry, which she attributes to the dust mites.     Review of Systems: a 14-point review of systems is pertinent for what is mentioned in HPI.  Otherwise, all other systems were negative. Constitutional: negative other than that listed in the HPI Eyes: negative other than that listed in the HPI Ears, nose,  mouth, throat, and face: negative other than that listed in the HPI Respiratory: negative other than that listed in the HPI Cardiovascular: negative other than that listed in the HPI Gastrointestinal: negative other than that listed in the HPI Genitourinary: negative other than that listed in the HPI Integument: negative other than that listed in the HPI Hematologic: negative other than that listed in the HPI Musculoskeletal: negative other  than that listed in the HPI Neurological: negative other than that listed in the HPI Allergy/Immunologic: negative other than that listed in the HPI    Objective:   Blood pressure 132/80, pulse 85, temperature 97.6 F (36.4 C), temperature source Oral, resp. rate 18, height 5' 4.57" (1.64 m), weight 179 lb 9.6 oz (81.5 kg), SpO2 96 %. Body mass index is 30.29 kg/m.   Physical Exam:  General: Alert, interactive, in no acute distress. Pleasant female. Very personable.  Eyes: No conjunctival injection present on the right, No conjunctival injection present on the left, PERRL bilaterally, No discharge on the right and No discharge on the left Ears: Right TM pearly gray with normal light reflex, Left TM pearly gray with normal light reflex, Right TM intact without perforation and Left TM intact without perforation.  Nose/Throat: External nose within normal limits and septum midline, turbinates edematous and pale with clear discharge, post-pharynx erythematous without cobblestoning in the posterior oropharynx. Tonsils 2+ without exudates Neck: Supple without thyromegaly. Lungs: Clear to auscultation without wheezing, rhonchi or rales. No increased work of breathing. CV: Normal S1/S2, no murmurs. Capillary refill <2 seconds.  Skin: Warm and dry, without lesions or rashes. Neuro:   Grossly intact. No focal deficits appreciated. Responsive to questions.   Diagnostic studies: none      Salvatore Marvel, MD [facility]

## 2017-01-06 NOTE — Patient Instructions (Addendum)
1. Allergic urticaria with multiple food allergies - Continue to avoid milk, casein, cashew, strawberry and almond. - I would consider introducing baked milk into your diet (cookies, cakes, etc).  - I anticipate that since you have tolerated hush puppies, then you should be fine with the baked milk. - Cow's milk (raw or otherwise) should still be avoided.  - Coconut and rice milks do not have protein.  - Soy milk is a fine alternative, as is Ripple milk (a pea-based protein).  - Continue with suppressive dosing of cetirizine: 10mg  twice daily (can decrease as tolerated if you would like).  2. Return in about 1 year (around 01/06/2018).   Please inform us of any Emergency Department visits, hospitalizations, or changes in symptoms. Call us before going to the ED for breathing or allergy symptoms since we might be able to fit you in for a sick visit. Feel free to contact us anytime with any questions, problems, or concerns.  It was a pleasure to see you again today! Happy summer!   Websites that have reliable patient information: 1. American Academy of Asthma, Allergy, and Immunology: www.aaaai.org 2. Food Allergy Research and Education (FARE): foodallergy.org 3. Mothers of Asthmatics: http://www.asthmacommunitynetwork.org 4. American College of Allergy, Asthma, and Immunology: www.acaai.org

## 2017-01-09 ENCOUNTER — Ambulatory Visit
Admission: RE | Admit: 2017-01-09 | Discharge: 2017-01-09 | Disposition: A | Discharge status: Home / Self Care | Payer: BC Managed Care – PPO | Source: Ambulatory Visit | Attending: Nurse Practitioner | Admitting: Nurse Practitioner

## 2017-01-09 ENCOUNTER — Other Ambulatory Visit: Payer: Self-pay | Admitting: Nurse Practitioner

## 2017-01-09 DIAGNOSIS — M5106 Intervertebral disc disorders with myelopathy, lumbar region: Secondary | ICD-10-CM

## 2017-01-09 IMAGING — XA DG EPIDURAL/NERVE ROOT
2 series · 2 of 2 positions shown · non-contrast
Comparison: none

CLINICAL DATA: Lumbosacral spondylosis without myelopathy with
radiculopathy. Right lower extremity numbness. Good response to the
L5-S1 interlaminar epidural injection last month, though with
persistent bothersome numbness. L5-S1 disc extrusion on MRI
affecting the right L5 nerve root.

[Series 1: ortho standard · 1 of 1 slices shown (1 of 2)]
[im 1/1]
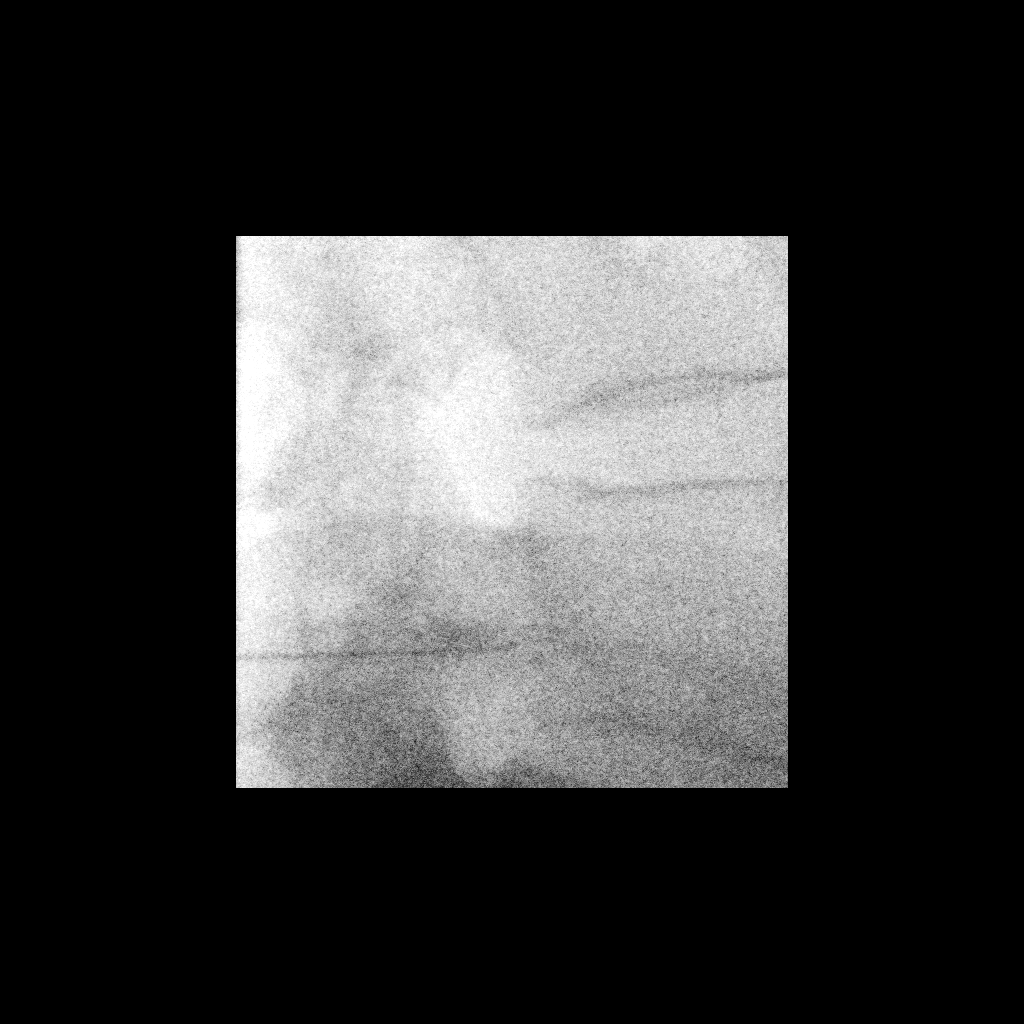

[Series 2: ortho standard · 1 of 1 slices shown (2 of 2)]
[im 1/1]
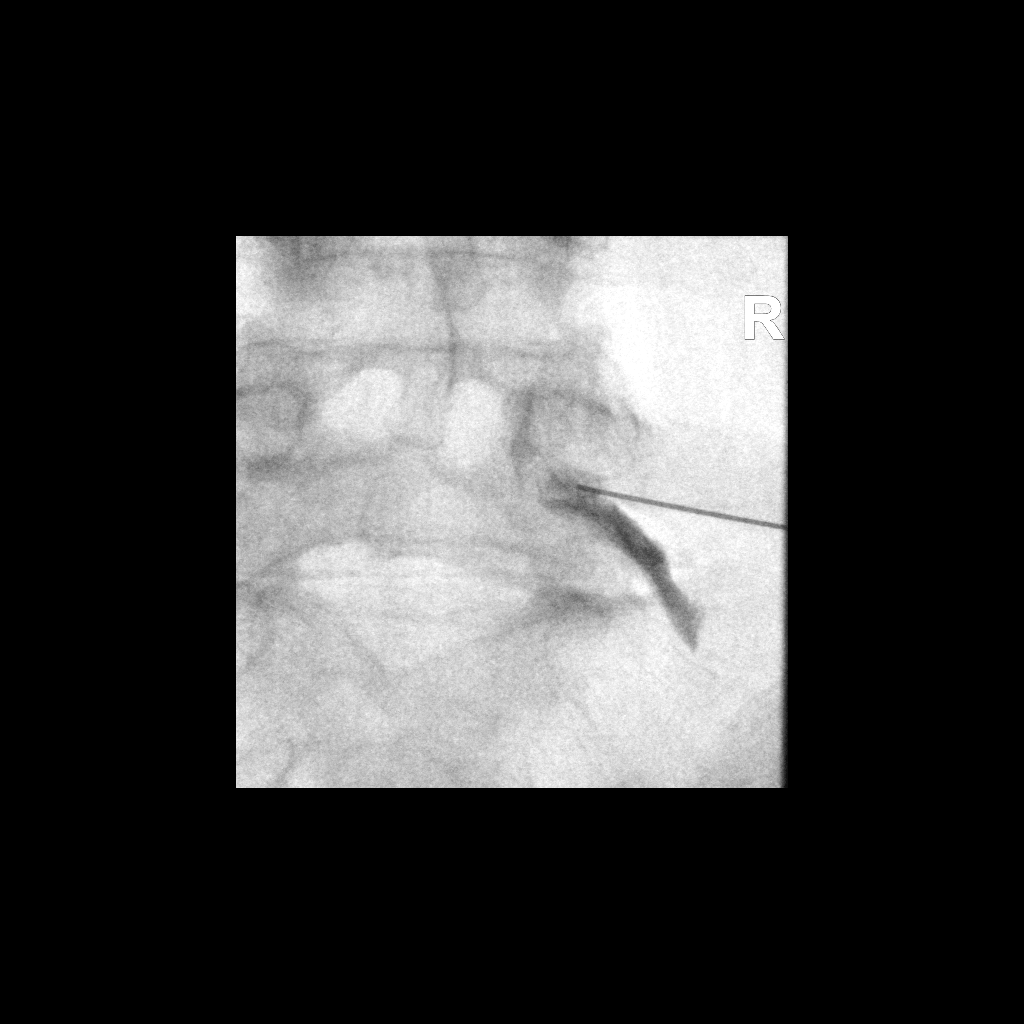

[2 of 2 positions shown; findings below may reference images not displayed]

EXAM:
EPIDURAL/NERVE ROOT

FLUOROSCOPY TIME:  Radiation Exposure Index (as provided by the
fluoroscopic device): 31.16 microGray*m^2

Fluoroscopy Time (in minutes and seconds):  14 seconds

PROCEDURE:
The procedure, risks, benefits, and alternatives were explained to
the patient. Questions regarding the procedure were encouraged and
answered. The patient understands and consents to the procedure.

RIGHT L5 NERVE ROOT BLOCK AND TRANSFORAMINAL EPIDURAL: A posterior
oblique approach was taken to the intervertebral foramen on the
right at L5-S1 using a curved 5 inch 22 gauge spinal needle.
Injection of Isovue-M 200 outlined the right L5 nerve root and
showed good epidural spread. No vascular opacification is seen. 120
mg of Depo-Medrol mixed with 2 mL of 1% lidocaine were instilled.
The procedure was well-tolerated, and the patient was discharged
thirty minutes following the injection in good condition.

COMPLICATIONS:
None
IMPRESSION: Technically successful injection consisting of a right L5 nerve root
block and transforaminal epidural.

## 2017-01-09 MED ORDER — METHYLPREDNISOLONE ACETATE 40 MG/ML INJ SUSP (RADIOLOG
120.0000 mg | Freq: Once | INTRAMUSCULAR | Status: AC
  Administered 2017-01-09: 120 mg via EPIDURAL

## 2017-01-09 MED ORDER — IOPAMIDOL (ISOVUE-M 200) INJECTION 41%
1.0000 mL | Freq: Once | INTRAMUSCULAR | Status: AC
  Administered 2017-01-09: 1 mL via EPIDURAL

## 2017-01-09 NOTE — Discharge Instructions (Signed)

## 2017-07-27 ENCOUNTER — Other Ambulatory Visit: Payer: Self-pay | Admitting: Nurse Practitioner

## 2017-07-27 DIAGNOSIS — M5106 Intervertebral disc disorders with myelopathy, lumbar region: Secondary | ICD-10-CM

## 2017-07-29 ENCOUNTER — Ambulatory Visit
Admission: RE | Admit: 2017-07-29 | Discharge: 2017-07-29 | Disposition: A | Discharge status: Home / Self Care | Payer: BC Managed Care – PPO | Source: Ambulatory Visit | Attending: Nurse Practitioner | Admitting: Nurse Practitioner

## 2017-07-29 DIAGNOSIS — M5106 Intervertebral disc disorders with myelopathy, lumbar region: Secondary | ICD-10-CM

## 2017-07-29 MED ORDER — METHYLPREDNISOLONE ACETATE 40 MG/ML INJ SUSP (RADIOLOG
1.0000 mg | Freq: Once | INTRAMUSCULAR | Status: AC
  Administered 2017-07-29: 1.2 mg via EPIDURAL

## 2017-07-29 MED ORDER — IOPAMIDOL (ISOVUE-M 200) INJECTION 41%
1.0000 mL | Freq: Once | INTRAMUSCULAR | Status: AC
  Administered 2017-07-29: 1 mL via EPIDURAL

## 2017-07-29 NOTE — Discharge Instructions (Signed)

## 2017-08-04 ENCOUNTER — Other Ambulatory Visit: Payer: BC Managed Care – PPO

## 2017-09-01 ENCOUNTER — Encounter (INDEPENDENT_AMBULATORY_CARE_PROVIDER_SITE_OTHER): Payer: Self-pay | Admitting: Internal Medicine

## 2017-09-01 ENCOUNTER — Ambulatory Visit (INDEPENDENT_AMBULATORY_CARE_PROVIDER_SITE_OTHER): Payer: BC Managed Care – PPO | Admitting: Internal Medicine

## 2017-09-01 VITALS — BP 164/82 | HR 84 | Temp 97.7°F | Ht 65.0 in | Wt 169.0 lb

## 2017-09-01 DIAGNOSIS — R1319 Other dysphagia: Secondary | ICD-10-CM

## 2017-09-01 DIAGNOSIS — K219 Gastro-esophageal reflux disease without esophagitis: Secondary | ICD-10-CM

## 2017-09-01 DIAGNOSIS — R1310 Dysphagia, unspecified: Secondary | ICD-10-CM

## 2017-09-01 NOTE — Patient Instructions (Signed)
Continue the Protonix. OV in 1 year.  

## 2017-09-01 NOTE — Progress Notes (Addendum)
Subjective:    Patient ID: Julie Hamilton, female    DOB: 1955-12-31, 62 y.o.   MRN: [id number] PCP Dr Pleas Koch.  HPI Here today for f/u. Last seen in March of 2018.  Hx of GERD and dysphagia. Maintained on Protonix.  Underwent and EGD/ED 05/30/2015 for dysphagia. Impression: Erosive esophagitis along with stricture at GE junction. Small sliding hiatal hernia. 5 mm ulcer at gastric body along the posterior wall. Biopsies taken. 5 mm hyperplastic appearing polyp just below the level of hiatus. This polyp was left alone. Esophagus dilated by passing 54 French Maloney dilator resulting in mucosal disruption at [employer]. Gastric biopsy shows gastritis but no ulcer and no evidence of H. Pylori.  She tells me she is doing good. She says the Protonix helps. Her appetite is good. She has lost about 8 pounds since her last visit. She is eating healthier and less. She is trying to do better. She is eating less sweats. She tries not to go overboard. Her BMs are normal. No melena or BRRB.  If she is in a hurry, she may have some dysphagia. She says usually occurs if she doesn't chew well.   She is Prediabetic x 7-8 yrs. Fasting BS 95-105.   Her last colonoscopy was in June of 2015 and was normal. (screening). Dr. Anthony Sar  Review of Systems Past Medical History:  Diagnosis Date  . Diabetes ([place])    x 4 yrs prediabetic  . GERD (gastroesophageal reflux disease)   . High cholesterol   . Hypertension     Past Surgical History:  Procedure Laterality Date  . ESOPHAGEAL DILATION Bilateral 05/30/2015   Procedure: ESOPHAGEAL DILATION;  Surgeon: Rogene Houston, MD;  Location: [facility];  Service: Endoscopy;  Laterality: Bilateral;  . ESOPHAGOGASTRODUODENOSCOPY N/A 05/30/2015   Procedure: ESOPHAGOGASTRODUODENOSCOPY (EGD);  Surgeon: Rogene Houston, MD;  Location: [facility];  Service: Endoscopy;  Laterality: N/A;  10:30  . TUBAL LIGATION      Allergies  Allergen Reactions    . Milk-Related Compounds Anaphylaxis  . Penicillins Hives  . Almond (Diagnostic) Rash  . Cashew Nut Oil Rash  . Strawberry (Diagnostic) Rash    Current Outpatient Medications on File Prior to Visit  Medication Sig Dispense Refill  . Calcium-Vitamin D (CALTRATE 600 PLUS-VIT D PO) Take by mouth.    . cetirizine (ZYRTEC) 10 MG tablet Take 10 mg by mouth 2 (two) times daily.    Marland Kitchen EPINEPHrine 0.3 mg/0.3 mL IJ SOAJ injection     . lovastatin (MEVACOR) 20 MG tablet Take 20 mg by mouth at bedtime.    . metFORMIN (GLUCOPHAGE) 500 MG tablet 500 mg 2 (two) times daily with a meal.     . Misc Natural Products (OSTEO BI-FLEX ADV DOUBLE ST PO) Take by mouth every morning.    . multivitamin-iron-minerals-folic acid (CENTRUM) chewable tablet Chew 1 tablet by mouth daily.    . pantoprazole (PROTONIX) 40 MG tablet TAKE ONE (1) TABLET EACH DAY 90 tablet 3   No current facility-administered medications on file prior to visit.         Objective:   Physical Exam Blood pressure (!) 164/82, pulse 84, temperature 97.7 F (36.5 C), height 5\' 5"  (1.651 m), weight 169 lb (76.7 kg).  Alert and oriented. Skin warm and dry. Oral mucosa is moist.   . Sclera anicteric, conjunctivae is pink. Thyroid not enlarged. No cervical lymphadenopathy. Lungs clear. Heart regular rate and rhythm.  Abdomen is soft. Bowel sounds are  positive. No hepatomegaly. No abdominal masses felt. No tenderness.  No edema to lower extremities.          Assessment & Plan:  GERD: Continue the Protonix. Dysphagia: She is not having any problems. She will continue the Protonix. OV in 1 year.

## 2017-11-20 ENCOUNTER — Other Ambulatory Visit (INDEPENDENT_AMBULATORY_CARE_PROVIDER_SITE_OTHER): Payer: Self-pay | Admitting: Internal Medicine

## 2018-01-05 ENCOUNTER — Ambulatory Visit: Payer: BC Managed Care – PPO | Admitting: Allergy & Immunology

## 2018-01-05 ENCOUNTER — Encounter: Payer: Self-pay | Admitting: Allergy & Immunology

## 2018-01-05 VITALS — BP 142/100 | HR 73 | Temp 97.6°F | Resp 16 | Ht 64.0 in | Wt 158.0 lb

## 2018-01-05 DIAGNOSIS — T781XXD Other adverse food reactions, not elsewhere classified, subsequent encounter: Secondary | ICD-10-CM

## 2018-01-05 DIAGNOSIS — J3089 Other allergic rhinitis: Secondary | ICD-10-CM | POA: Diagnosis not present

## 2018-01-05 DIAGNOSIS — J302 Other seasonal allergic rhinitis: Secondary | ICD-10-CM | POA: Diagnosis not present

## 2018-01-05 DIAGNOSIS — L500 Allergic urticaria: Secondary | ICD-10-CM

## 2018-01-05 MED ORDER — EPINEPHRINE 0.3 MG/0.3ML IJ SOAJ: 0.3000 mg | Freq: Once | INTRAMUSCULAR | 2 refills | Status: AC

## 2018-01-05 NOTE — Patient Instructions (Addendum)
1. Allergic urticaria with multiple food allergies - Continue to avoid milk, casein, cashew, strawberry and almond. - Peanuts should be safe as well as other tree nuts (as long as there is no cross contamination).  - I would consider adding on soy milk into your diet to add some more protein (coconut milk has no protein at all). - You can try carob chips as well (I believe they are milk free).  - We will send in a prescription for AuviQ (epinephrine) since that has a $0 co-payment (call right before you enroll in Medicare and get a refill) - Continue with suppressive dosing of cetirizine: 10mg  twice daily (can decrease as tolerated if you would like).  2. Return in about 1 year (around 01/06/2019).   Please inform us of any Emergency Department visits, hospitalizations, or changes in symptoms. Call us before going to the ED for breathing or allergy symptoms since we might be able to fit you in for a sick visit. Feel free to contact us anytime with any questions, problems, or concerns.  It was a pleasure to see you again today! Enjoy the rest of summer!   Websites that have reliable patient information: 1. American Academy of Asthma, Allergy, and Immunology: www.aaaai.org 2. Food Allergy Research and Education (FARE): foodallergy.org 3. Mothers of Asthmatics: http://www.asthmacommunitynetwork.org 4. American College of Allergy, Asthma, and Immunology: www.acaai.org

## 2018-01-05 NOTE — Progress Notes (Signed)
FOLLOW UP  Date of Service/Encounter:  01/05/18   Assessment:   Allergic urticaria  Adverse food reaction (milk, casein, cashew, strawberry and almond)  Perennial and seasonal allergic rhinitis (grasses, weeds, trees and dust mites)   Plan/Recommendations:   1. Allergic urticaria with multiple food allergies - Continue to avoid milk, casein, cashew, strawberry and almond. Julie Hamilton seems to have a good grasp of how to bo about avoiding these foods.  - Peanuts should be safe as well as other tree nuts (as long as there is no cross contamination).  - I would consider adding on soy milk into your diet to add some more protein (coconut milk has no protein at all). - You can try carob chips as well (I believe they are milk free).  - We will send in a prescription for AuviQ (epinephrine) since that has a $0 co-payment (call right before you enroll in Medicare and get a refill) - Continue with suppressive dosing of cetirizine: 10mg  twice daily (can decrease as tolerated if you would like).  2. P/SAR (grasses, weeds, trees and dust mites) - Controlled with cetirizine BID.  - There is no need for allergen immunotherapy at this time.   3. Return in about 1 year (around 01/06/2019).  Subjective:   Julie Hamilton is a 62 y.o. female presenting today for follow up of  Chief Complaint  Patient presents with  . Follow-up    1 year follow up     Julie Hamilton has a history of the following: Patient Active Problem List   Diagnosis Date Noted  . Allergic urticaria 01/06/2017  . Adverse food reaction 01/06/2017  . Chronic urticaria 01/22/2016  . Diabetes ([place]) 05/28/2015  . High cholesterol 05/28/2015  . Dysphagia 05/28/2015    History obtained from: chart review and patient.  Julie Hamilton Primary Care Provider is Burdine, Virgina Evener, MD.     Julie Hamilton is a 62 y.o. female presenting for a follow up visit. She was last seen in July 2018. At that time, she was doing very well  with avoidance of milk, casein, cashew, strawberry, and almond. We continued her on cetirizine 10mg  BID.  Since the last visit, she has done very well. She is avoiding all of her triggering foods. If she does not trust it at all, she avoids completely. She is very careful at restaurants. She will keep Benadryl to calm any reactions. She estimates that she has had a handful of accidental exposures, most notably with exposure to a chicken salad that likely had some sour cream.  She is currently doing coconut milk. She does report getting protein in other sources, but she is open to putting some soy milk into her diet instead.   She can tolerate dark chocolate. She does eat dark chocolate with hazelnuts. She has not tried carob chips at all. She avoids milk chocolate without a problem.   Julie Hamilton have a history of allergic rhinitis and was tested at our office in 2005. Testing at the time demonstrated positives to grasses, weeds, trees and equivocal results to dust mites. She reports that her symptoms are under good control with the use of cetirizine BID. She is not on a nasal spray at all.   Julie Hamilton originally saw [place] in 2005 and had food testing that showed equivocal results to shellfish, fish, and cashew. However she did not avoid any foods following that visit in 2005 and never followed up again until August 2017. She is currently avoiding milk, casein, cashew,  almonds, and strawberries.   Otherwise, there have been no changes to her past medical history, surgical history, family history, or social history.    Review of Systems: a 14-point review of systems is pertinent for what is mentioned in HPI.  Otherwise, all other systems were negative. Constitutional: negative other than that listed in the HPI Eyes: negative other than that listed in the HPI Ears, nose, mouth, throat, and face: negative other than that listed in the HPI Respiratory: negative other than that listed in the  HPI Cardiovascular: negative other than that listed in the HPI Gastrointestinal: negative other than that listed in the HPI Genitourinary: negative other than that listed in the HPI Integument: negative other than that listed in the HPI Hematologic: negative other than that listed in the HPI Musculoskeletal: negative other than that listed in the HPI Neurological: negative other than that listed in the HPI Allergy/Immunologic: negative other than that listed in the HPI    Objective:   Blood pressure (!) 142/100, pulse 73, temperature 97.6 F (36.4 C), temperature source Oral, resp. rate 16, height 5\' 4"  (1.626 m), weight 158 lb (71.7 kg), SpO2 98 %. Body mass index is 27.12 kg/m.   Physical Exam:  General: Alert, interactive, in no acute distress. Very pleasant and smiling.  Eyes: No conjunctival injection bilaterally, no discharge on the right, no discharge on the left and no Horner-Trantas dots present. PERRL bilaterally. EOMI without pain. No photophobia.  Ears: Right TM pearly gray with normal light reflex, Left TM pearly gray with normal light reflex, Right TM intact without perforation and Left TM intact without perforation.  Nose/Throat: External nose within normal limits and septum midline. Turbinates edematous and pale with clear discharge. Posterior oropharynx erythematous without cobblestoning in the posterior oropharynx. Tonsils 2+ without exudates.  Tongue without thrush. Lungs: Clear to auscultation without wheezing, rhonchi or rales. No increased work of breathing. CV: Normal S1/S2. No murmurs. Capillary refill <2 seconds.  Skin: Warm and dry, without lesions or rashes. Neuro:   Grossly intact. No focal deficits appreciated. Responsive to questions.  Diagnostic studies: none    Salvatore Marvel, MD  [facility] [place] [facility]

## 2018-08-11 DIAGNOSIS — R300 Dysuria: Secondary | ICD-10-CM | POA: Diagnosis not present

## 2018-08-13 DIAGNOSIS — E1169 Type 2 diabetes mellitus with other specified complication: Secondary | ICD-10-CM | POA: Diagnosis not present

## 2018-08-13 DIAGNOSIS — K253 Acute gastric ulcer without hemorrhage or perforation: Secondary | ICD-10-CM | POA: Diagnosis not present

## 2018-08-13 DIAGNOSIS — I10 Essential (primary) hypertension: Secondary | ICD-10-CM | POA: Diagnosis not present

## 2018-08-13 DIAGNOSIS — K219 Gastro-esophageal reflux disease without esophagitis: Secondary | ICD-10-CM | POA: Diagnosis not present

## 2018-08-14 ENCOUNTER — Other Ambulatory Visit (INDEPENDENT_AMBULATORY_CARE_PROVIDER_SITE_OTHER): Payer: Self-pay | Admitting: Internal Medicine

## 2018-08-18 DIAGNOSIS — I10 Essential (primary) hypertension: Secondary | ICD-10-CM | POA: Diagnosis not present

## 2018-08-18 DIAGNOSIS — E1169 Type 2 diabetes mellitus with other specified complication: Secondary | ICD-10-CM | POA: Diagnosis not present

## 2018-08-18 DIAGNOSIS — M5116 Intervertebral disc disorders with radiculopathy, lumbar region: Secondary | ICD-10-CM | POA: Diagnosis not present

## 2018-08-18 DIAGNOSIS — K219 Gastro-esophageal reflux disease without esophagitis: Secondary | ICD-10-CM | POA: Diagnosis not present

## 2018-08-25 DIAGNOSIS — M9901 Segmental and somatic dysfunction of cervical region: Secondary | ICD-10-CM | POA: Diagnosis not present

## 2018-08-25 DIAGNOSIS — M5033 Other cervical disc degeneration, cervicothoracic region: Secondary | ICD-10-CM | POA: Diagnosis not present

## 2018-08-31 DIAGNOSIS — M5033 Other cervical disc degeneration, cervicothoracic region: Secondary | ICD-10-CM | POA: Diagnosis not present

## 2018-08-31 DIAGNOSIS — M9901 Segmental and somatic dysfunction of cervical region: Secondary | ICD-10-CM | POA: Diagnosis not present

## 2018-09-08 DIAGNOSIS — M9901 Segmental and somatic dysfunction of cervical region: Secondary | ICD-10-CM | POA: Diagnosis not present

## 2018-09-08 DIAGNOSIS — M5033 Other cervical disc degeneration, cervicothoracic region: Secondary | ICD-10-CM | POA: Diagnosis not present

## 2018-09-13 DIAGNOSIS — M9901 Segmental and somatic dysfunction of cervical region: Secondary | ICD-10-CM | POA: Diagnosis not present

## 2018-09-13 DIAGNOSIS — M5033 Other cervical disc degeneration, cervicothoracic region: Secondary | ICD-10-CM | POA: Diagnosis not present

## 2018-09-20 DIAGNOSIS — L570 Actinic keratosis: Secondary | ICD-10-CM | POA: Diagnosis not present

## 2018-09-21 DIAGNOSIS — M9901 Segmental and somatic dysfunction of cervical region: Secondary | ICD-10-CM | POA: Diagnosis not present

## 2018-09-21 DIAGNOSIS — M5033 Other cervical disc degeneration, cervicothoracic region: Secondary | ICD-10-CM | POA: Diagnosis not present

## 2018-09-21 DIAGNOSIS — N200 Calculus of kidney: Secondary | ICD-10-CM | POA: Diagnosis not present

## 2018-09-28 DIAGNOSIS — M9901 Segmental and somatic dysfunction of cervical region: Secondary | ICD-10-CM | POA: Diagnosis not present

## 2018-09-28 DIAGNOSIS — M5033 Other cervical disc degeneration, cervicothoracic region: Secondary | ICD-10-CM | POA: Diagnosis not present

## 2018-10-05 DIAGNOSIS — M9901 Segmental and somatic dysfunction of cervical region: Secondary | ICD-10-CM | POA: Diagnosis not present

## 2018-10-05 DIAGNOSIS — M5033 Other cervical disc degeneration, cervicothoracic region: Secondary | ICD-10-CM | POA: Diagnosis not present

## 2018-10-12 DIAGNOSIS — M5033 Other cervical disc degeneration, cervicothoracic region: Secondary | ICD-10-CM | POA: Diagnosis not present

## 2018-10-12 DIAGNOSIS — M9901 Segmental and somatic dysfunction of cervical region: Secondary | ICD-10-CM | POA: Diagnosis not present

## 2018-10-18 DIAGNOSIS — M5033 Other cervical disc degeneration, cervicothoracic region: Secondary | ICD-10-CM | POA: Diagnosis not present

## 2018-10-18 DIAGNOSIS — M9901 Segmental and somatic dysfunction of cervical region: Secondary | ICD-10-CM | POA: Diagnosis not present

## 2019-01-04 ENCOUNTER — Ambulatory Visit: Payer: BC Managed Care – PPO | Admitting: Allergy & Immunology

## 2019-01-05 ENCOUNTER — Ambulatory Visit: Payer: BC Managed Care – PPO | Admitting: Allergy & Immunology

## 2019-01-12 ENCOUNTER — Ambulatory Visit: Payer: BC Managed Care – PPO | Admitting: Allergy & Immunology

## 2019-01-19 ENCOUNTER — Ambulatory Visit (INDEPENDENT_AMBULATORY_CARE_PROVIDER_SITE_OTHER): Payer: Medicare Other | Admitting: Allergy & Immunology

## 2019-01-19 ENCOUNTER — Other Ambulatory Visit: Payer: Self-pay

## 2019-01-19 ENCOUNTER — Encounter: Payer: Self-pay | Admitting: Allergy & Immunology

## 2019-01-19 DIAGNOSIS — J3089 Other allergic rhinitis: Secondary | ICD-10-CM | POA: Diagnosis not present

## 2019-01-19 DIAGNOSIS — J302 Other seasonal allergic rhinitis: Secondary | ICD-10-CM | POA: Diagnosis not present

## 2019-01-19 DIAGNOSIS — T781XXD Other adverse food reactions, not elsewhere classified, subsequent encounter: Secondary | ICD-10-CM | POA: Diagnosis not present

## 2019-01-19 DIAGNOSIS — L500 Allergic urticaria: Secondary | ICD-10-CM | POA: Diagnosis not present

## 2019-01-19 MED ORDER — EPINEPHRINE 0.3 MG/0.3ML IJ SOAJ: 0.3000 mg | Freq: Once | INTRAMUSCULAR | 1 refills | Status: AC | PRN

## 2019-01-19 NOTE — Progress Notes (Signed)
RE: Julie Hamilton MRN: [id number] DOB: Jan 15, 1956 Date of Telemedicine Visit: 01/19/2019  Referring provider: [name], MD Primary care provider: [name], MD  Chief Complaint: Urticaria (she has not had any reactions to berries or nuts because she is very careful about avoiding those. she does get the rare rash on her arm or wrist from contact with dust particles. she does not report any severe issues. )   Telemedicine Follow Up Visit via Telephone: I connected with Julie Hamilton for a follow up on 01/19/19 by telephone and verified that I am speaking with the correct person using two identifiers.   I discussed the limitations, risks, security and privacy concerns of performing an evaluation and management service by telephone and the availability of in person appointments. I also discussed with the patient that there may be a patient responsible charge related to this service. The patient expressed understanding and agreed to proceed.  Patient is at home accompanied by 11 who provided/contributed to the history.  Provider is at the office.  Visit start time: 11:21 AM Visit end time: 11:43 AM Insurance consent/check in by: Anderson [place] Medical consent and medical assistant/nurse: Kayla  History of Present Illness:  She is a 63 y.o. female, who is being followed for perennial and seasonal allergic rhinitis as well as adverse food reactions and allergic urticaria. Her previous allergy office visit was in July 2019 with myself.  At the last visit, despite her multiple sensitizations, her allergic rhinitis was well controlled with cetirizine twice daily.  For her urticaria, we recommended continued avoidance of milk, casein, cashew, strawberry, and almond.  I did recommend adding peanuts into her diet since she was only allergic to tree nuts.  I also recommended adding on soy milk to add more protein since she was on coconut milk which contains essentially no protein at all.  We  sent in a prescription for an epinephrine autoinjector.  We also continue with cetirizine 10 mg twice daily.  Since last visit, she has done very well. She has missed cheese but there is some dairy free cheese that she has found in [employer]. This was cheaper than the natural food store. She is current using soy and coconut milk for her cow's milk substitute. She vacillates between the two of them. She does report some GI distress with the metformin and the pantoprazole.   She has had no problems with urticaria. She will have some breakouts on her wrist and forearms. This has been quite some time. She remains on the cetirizine 10mg  twice daily. She uses a pill box and has been very compliant with her medications. She has not tried taking only once daily.   She does clear her throat occasionally. This has been a problem since she started lisinopril. She has not talked to her PCP about it. It does not bug her very much, but she will mention it to him when she sees him.   Otherwise, there have been no changes to her past medical history, surgical history, family history, or social history. She has been maintaining social distancing during the [place] pandemic.   Assessment and Plan:  [name] is a 63 y.o. female with:  Allergic urticaria  Adverse food reaction(milk, casein, cashew, strawberry and almond)  Perennial and seasonal allergic rhinitis (grasses, weeds, trees and dust mites)   1. Allergic urticaria with multiple food allergies - Continue to avoid milk, casein, cashew, strawberry and almond. - Try decreasing the cetirizine down to 10mg  daily. -  Call us in two weeks to let us know how that goes.   2. Return in about 1 year (around 01/19/2020). This can be an in-person, a virtual Webex or a telephone follow up visit.     Diagnostics: None.  Medication List:  Current Outpatient Medications  Medication Sig Dispense Refill  . Calcium-Vitamin D (CALTRATE 600 PLUS-VIT D PO) Take by  mouth.    . cetirizine (ZYRTEC) 10 MG tablet Take 10 mg by mouth 2 (two) times daily.    . hydrochlorothiazide (HYDRODIURIL) 12.5 MG tablet Take 12.5 mg by mouth daily.    Marland Kitchen lisinopril (ZESTRIL) 10 MG tablet Take 10 mg by mouth daily.    Marland Kitchen lovastatin (MEVACOR) 20 MG tablet Take 20 mg by mouth at bedtime.    . metFORMIN (GLUCOPHAGE) 500 MG tablet 500 mg 2 (two) times daily with a meal.     . Misc Natural Products (OSTEO BI-FLEX ADV DOUBLE ST PO) Take by mouth every morning.    . multivitamin-iron-minerals-folic acid (CENTRUM) chewable tablet Chew 1 tablet by mouth daily.    . pantoprazole (PROTONIX) 40 MG tablet TAKE ONE (1) TABLET EACH DAY 90 tablet 3   No current facility-administered medications for this visit.    Allergies: Allergies  Allergen Reactions  . Milk-Related Compounds Anaphylaxis  . Penicillins Hives  . Prednisone Other (See Comments)    Flushing of her face.   Elmer Bales (Diagnostic) Rash  . Cashew Nut Oil Rash  . Strawberry (Diagnostic) Rash   I reviewed her past medical history, social history, family history, and environmental history and no significant changes have been reported from previous visits.  Review of Systems  Constitutional: Negative for activity change, appetite change, chills, fatigue and fever.  HENT: Negative for congestion, ear pain, postnasal drip, rhinorrhea, sinus pressure and sore throat.   Eyes: Negative for pain, discharge, redness and itching.  Respiratory: Negative for cough, choking, shortness of breath, wheezing and stridor.   Gastrointestinal: Negative for diarrhea, nausea and vomiting.  Musculoskeletal: Negative for arthralgias, joint swelling and myalgias.  Skin: Negative for rash.  Allergic/Immunologic: Negative for environmental allergies and food allergies.    Objective:  Physical exam not obtained as encounter was done via telephone.   Previous notes and tests were reviewed.  I discussed the assessment and treatment plan with  the patient. The patient was provided an opportunity to ask questions and all were answered. The patient agreed with the plan and demonstrated an understanding of the instructions.   The patient was advised to call back or seek an in-person evaluation if the symptoms worsen or if the condition fails to improve as anticipated.  I provided 22 minutes of non-face-to-face time during this encounter.  It was my pleasure to participate in Julie Hamilton care today. Please feel free to contact me with any questions or concerns.   Sincerely,  Valentina Shaggy, MD

## 2019-01-19 NOTE — Patient Instructions (Addendum)
1. Allergic urticaria with multiple food allergies - Continue to avoid milk, casein, cashew, strawberry and almond. - Try decreasing the cetirizine down to 10mg  daily. - Call us in two weeks to let us know how that goes.   2. Return in about 1 year (around 01/19/2020). This can be an in-person, a virtual Webex or a telephone follow up visit.   Please inform us of any Emergency Department visits, hospitalizations, or changes in symptoms. Call us before going to the ED for breathing or allergy symptoms since we might be able to fit you in for a sick visit. Feel free to contact us anytime with any questions, problems, or concerns.  It was a pleasure to talk to you today today!  Websites that have reliable patient information: 1. American Academy of Asthma, Allergy, and Immunology: www.aaaai.org 2. Food Allergy Research and Education (FARE): foodallergy.org 3. Mothers of Asthmatics: http://www.asthmacommunitynetwork.org 4. American College of Allergy, Asthma, and Immunology: www.acaai.org  "Like" [place] on Facebook and Instagram for our latest updates!      Make sure you are registered to vote! If you have moved or changed any of your contact information, you will need to get this updated before voting!  In some cases, you MAY be able to register to vote online: [email or web address]    Voter ID laws are NOT going into effect for the General Election in November 2020! DO NOT let this stop you from exercising your right to vote!   Absentee voting is the SAFEST way to vote during the coronavirus pandemic!   Download and print an absentee ballot request form at [email or web address] or you can scan the QR code below with your smart phone:      More information on absentee ballots can be found here: [email or web address]

## 2019-02-25 DIAGNOSIS — I10 Essential (primary) hypertension: Secondary | ICD-10-CM | POA: Diagnosis not present

## 2019-02-25 DIAGNOSIS — E1169 Type 2 diabetes mellitus with other specified complication: Secondary | ICD-10-CM | POA: Diagnosis not present

## 2019-02-25 DIAGNOSIS — R5382 Chronic fatigue, unspecified: Secondary | ICD-10-CM | POA: Diagnosis not present

## 2019-02-25 DIAGNOSIS — K219 Gastro-esophageal reflux disease without esophagitis: Secondary | ICD-10-CM | POA: Diagnosis not present

## 2019-03-01 DIAGNOSIS — E1169 Type 2 diabetes mellitus with other specified complication: Secondary | ICD-10-CM | POA: Diagnosis not present

## 2019-03-01 DIAGNOSIS — I10 Essential (primary) hypertension: Secondary | ICD-10-CM | POA: Diagnosis not present

## 2019-03-01 DIAGNOSIS — Z0001 Encounter for general adult medical examination with abnormal findings: Secondary | ICD-10-CM | POA: Diagnosis not present

## 2019-03-01 DIAGNOSIS — M8580 Other specified disorders of bone density and structure, unspecified site: Secondary | ICD-10-CM | POA: Diagnosis not present

## 2019-03-07 ENCOUNTER — Telehealth: Payer: Self-pay

## 2019-03-07 NOTE — Telephone Encounter (Signed)
That is fine with me.  Joel Gallagher, MD [facility]  

## 2019-03-07 NOTE — Telephone Encounter (Signed)
Called and left voicemail asking patient to return call to discuss.  

## 2019-03-07 NOTE — Telephone Encounter (Signed)
Pt returned call, information given.  Pt had no further questions or concerns

## 2019-03-07 NOTE — Telephone Encounter (Signed)
Patient is wondering if she can take Cetirizine 2x/day instead of the 1x/day. She states Dr Ernst Bowler had her on 2x a day before.  Please Advise.

## 2019-03-16 DIAGNOSIS — E1169 Type 2 diabetes mellitus with other specified complication: Secondary | ICD-10-CM | POA: Diagnosis not present

## 2019-03-16 DIAGNOSIS — I10 Essential (primary) hypertension: Secondary | ICD-10-CM | POA: Diagnosis not present

## 2019-03-22 DIAGNOSIS — D239 Other benign neoplasm of skin, unspecified: Secondary | ICD-10-CM | POA: Diagnosis not present

## 2019-03-22 DIAGNOSIS — D1801 Hemangioma of skin and subcutaneous tissue: Secondary | ICD-10-CM | POA: Diagnosis not present

## 2019-03-22 DIAGNOSIS — Z85820 Personal history of malignant melanoma of skin: Secondary | ICD-10-CM | POA: Diagnosis not present

## 2019-03-22 DIAGNOSIS — L570 Actinic keratosis: Secondary | ICD-10-CM | POA: Diagnosis not present

## 2019-04-15 DIAGNOSIS — E1169 Type 2 diabetes mellitus with other specified complication: Secondary | ICD-10-CM | POA: Diagnosis not present

## 2019-04-15 DIAGNOSIS — I10 Essential (primary) hypertension: Secondary | ICD-10-CM | POA: Diagnosis not present

## 2019-05-02 DIAGNOSIS — M85851 Other specified disorders of bone density and structure, right thigh: Secondary | ICD-10-CM | POA: Diagnosis not present

## 2019-05-02 DIAGNOSIS — M810 Age-related osteoporosis without current pathological fracture: Secondary | ICD-10-CM | POA: Diagnosis not present

## 2019-05-02 DIAGNOSIS — M8588 Other specified disorders of bone density and structure, other site: Secondary | ICD-10-CM | POA: Diagnosis not present

## 2019-05-16 DIAGNOSIS — E1169 Type 2 diabetes mellitus with other specified complication: Secondary | ICD-10-CM | POA: Diagnosis not present

## 2019-05-16 DIAGNOSIS — I10 Essential (primary) hypertension: Secondary | ICD-10-CM | POA: Diagnosis not present

## 2019-05-30 DIAGNOSIS — R69 Illness, unspecified: Secondary | ICD-10-CM | POA: Diagnosis not present

## 2019-06-07 DIAGNOSIS — Z6831 Body mass index (BMI) 31.0-31.9, adult: Secondary | ICD-10-CM | POA: Diagnosis not present

## 2019-06-07 DIAGNOSIS — Z01419 Encounter for gynecological examination (general) (routine) without abnormal findings: Secondary | ICD-10-CM | POA: Diagnosis not present

## 2019-07-11 DIAGNOSIS — Z1231 Encounter for screening mammogram for malignant neoplasm of breast: Secondary | ICD-10-CM | POA: Diagnosis not present

## 2019-07-15 DIAGNOSIS — E1169 Type 2 diabetes mellitus with other specified complication: Secondary | ICD-10-CM | POA: Diagnosis not present

## 2019-07-15 DIAGNOSIS — I10 Essential (primary) hypertension: Secondary | ICD-10-CM | POA: Diagnosis not present

## 2019-08-12 DIAGNOSIS — I10 Essential (primary) hypertension: Secondary | ICD-10-CM | POA: Diagnosis not present

## 2019-08-12 DIAGNOSIS — E1169 Type 2 diabetes mellitus with other specified complication: Secondary | ICD-10-CM | POA: Diagnosis not present

## 2019-08-23 DIAGNOSIS — K219 Gastro-esophageal reflux disease without esophagitis: Secondary | ICD-10-CM | POA: Diagnosis not present

## 2019-08-23 DIAGNOSIS — E1169 Type 2 diabetes mellitus with other specified complication: Secondary | ICD-10-CM | POA: Diagnosis not present

## 2019-08-23 DIAGNOSIS — I10 Essential (primary) hypertension: Secondary | ICD-10-CM | POA: Diagnosis not present

## 2019-08-29 DIAGNOSIS — E1169 Type 2 diabetes mellitus with other specified complication: Secondary | ICD-10-CM | POA: Diagnosis not present

## 2019-08-29 DIAGNOSIS — Z6830 Body mass index (BMI) 30.0-30.9, adult: Secondary | ICD-10-CM | POA: Diagnosis not present

## 2019-08-29 DIAGNOSIS — I10 Essential (primary) hypertension: Secondary | ICD-10-CM | POA: Diagnosis not present

## 2019-08-29 DIAGNOSIS — M5116 Intervertebral disc disorders with radiculopathy, lumbar region: Secondary | ICD-10-CM | POA: Diagnosis not present

## 2019-08-29 DIAGNOSIS — K219 Gastro-esophageal reflux disease without esophagitis: Secondary | ICD-10-CM | POA: Diagnosis not present

## 2019-09-14 DIAGNOSIS — R7301 Impaired fasting glucose: Secondary | ICD-10-CM | POA: Diagnosis not present

## 2019-09-14 DIAGNOSIS — I10 Essential (primary) hypertension: Secondary | ICD-10-CM | POA: Diagnosis not present

## 2019-09-14 DIAGNOSIS — K219 Gastro-esophageal reflux disease without esophagitis: Secondary | ICD-10-CM | POA: Diagnosis not present

## 2019-09-20 DIAGNOSIS — L570 Actinic keratosis: Secondary | ICD-10-CM | POA: Diagnosis not present

## 2019-09-20 DIAGNOSIS — D1801 Hemangioma of skin and subcutaneous tissue: Secondary | ICD-10-CM | POA: Diagnosis not present

## 2019-09-20 DIAGNOSIS — Z85828 Personal history of other malignant neoplasm of skin: Secondary | ICD-10-CM | POA: Diagnosis not present

## 2019-09-20 DIAGNOSIS — D230 Other benign neoplasm of skin of lip: Secondary | ICD-10-CM | POA: Diagnosis not present

## 2019-10-14 DIAGNOSIS — I10 Essential (primary) hypertension: Secondary | ICD-10-CM | POA: Diagnosis not present

## 2019-10-14 DIAGNOSIS — K219 Gastro-esophageal reflux disease without esophagitis: Secondary | ICD-10-CM | POA: Diagnosis not present

## 2019-11-24 ENCOUNTER — Ambulatory Visit (INDEPENDENT_AMBULATORY_CARE_PROVIDER_SITE_OTHER): Payer: Medicare Other | Admitting: Gastroenterology

## 2019-11-24 ENCOUNTER — Other Ambulatory Visit: Payer: Self-pay

## 2019-11-24 ENCOUNTER — Encounter (INDEPENDENT_AMBULATORY_CARE_PROVIDER_SITE_OTHER): Payer: Self-pay | Admitting: Gastroenterology

## 2019-11-24 VITALS — BP 116/79 | HR 103 | Temp 98.6°F | Ht 65.0 in | Wt 181.5 lb

## 2019-11-24 DIAGNOSIS — K219 Gastro-esophageal reflux disease without esophagitis: Secondary | ICD-10-CM | POA: Diagnosis not present

## 2019-11-24 MED ORDER — PANTOPRAZOLE SODIUM 20 MG PO TBEC: 20.0000 mg | DELAYED_RELEASE_TABLET | Freq: Every day | ORAL | 3 refills | Status: DC

## 2019-11-24 NOTE — Patient Instructions (Signed)
Please call me w/ any symptoms w/ dose decrease. Also call if swallowing issues reoccur.

## 2019-11-24 NOTE — Progress Notes (Signed)
Patient profile: Julie Hamilton is a 64 y.o. female seen for follow-up of GERD.  She was last seen in clinic 2019.  History of Present Illness: Julie Hamilton is seen today for GERD-she reports very minimal symptoms on her current dose of 40 mg Protonix once a day, she reports she does have occasional rare breakthrough symptoms these will resolve with occasional Tums.  She denies any nausea vomiting.  She has over the past year developed approximately once a month issues with very mild dysphagia to chicken and steak, feels these can be a little more comfortable in her esophageal area but will pass with drinking liquids.  Reports she had severe symptoms prior to endoscopy in 2016 but these completely resolved.  She is also having loose stools with Metformin in the past, this was switched to a Metformin extended release she is now having 1-2 bowel movements a day that are soft with occasional urgency but much more controllable.  Denies any rectal bleeding or abdominal pain.  Wt Readings from Last 3 Encounters:  11/24/19 181 lb 8 oz (82.3 kg)  01/05/18 158 lb (71.7 kg)  09/01/17 169 lb (76.7 kg)     Last Colonoscopy: 2015-normal  Last Endoscopy: Impression: 2016 Erosive esophagitis along with stricture at GE junction. Small sliding hiatal hernia. 5 mm ulcer at gastric body along the posterior wall. Biopsies taken. 5 mm hyperplastic appearing polyp just below the level of hiatus. This polyp was left alone. Esophagus dilated by passing 54 French Maloney dilator resulting in mucosal disruption at [employer].    Past Medical History:  Past Medical History:  Diagnosis Date  . Angio-edema   . Diabetes ([place])    x 4 yrs prediabetic  . GERD (gastroesophageal reflux disease)   . High cholesterol   . Hypertension   . Urticaria     Problem List: Patient Active Problem List   Diagnosis Date Noted  . Allergic urticaria 01/06/2017  . Adverse food reaction 01/06/2017  . Chronic  urticaria 01/22/2016  . Diabetes ([place]) 05/28/2015  . High cholesterol 05/28/2015  . Dysphagia 05/28/2015    Past Surgical History: Past Surgical History:  Procedure Laterality Date  . ESOPHAGEAL DILATION Bilateral 05/30/2015   Procedure: ESOPHAGEAL DILATION;  Surgeon: Rogene Houston, MD;  [facility];  Service: Endoscopy;  Laterality: Bilateral;  . ESOPHAGOGASTRODUODENOSCOPY N/A 05/30/2015   Procedure: ESOPHAGOGASTRODUODENOSCOPY (EGD);  Surgeon: Rogene Houston, MD;  Location[facility];  Service: Endoscopy;  Laterality: N/A;  10:30  . TUBAL LIGATION      Allergies: Allergies  Allergen Reactions  . Milk-Related Compounds Anaphylaxis  . Penicillins Hives  . Prednisone Other (See Comments)    Flushing of her face.   Elmer Bales (Diagnostic) Rash  . Cashew Nut Oil Rash  . Strawberry (Diagnostic) Rash      Home Medications:  Current Outpatient Medications:  .  Calcium-Vitamin D (CALTRATE 600 PLUS-VIT D PO), Take by mouth in the morning and at bedtime. , Disp: , Rfl:  .  cetirizine (ZYRTEC) 10 MG tablet, Take 10 mg by mouth 2 (two) times daily., Disp: , Rfl:  .  EPINEPHrine 0.3 mg/0.3 mL IJ SOAJ injection, Inject 0.3 mLs (0.3 mg total) into the muscle once as needed for anaphylaxis., Disp: 2 each, Rfl: 1 .  hydrochlorothiazide (HYDRODIURIL) 12.5 MG tablet, Take 12.5 mg by mouth daily., Disp: , Rfl:  .  lisinopril (ZESTRIL) 10 MG tablet, Take 10 mg by mouth daily., Disp: , Rfl:  .  lovastatin (MEVACOR) 20 MG tablet, Take 20 mg by mouth at bedtime., Disp: , Rfl:  .  metFORMIN (GLUCOPHAGE-XR) 500 MG 24 hr tablet, Take 500 mg by mouth 2 (two) times daily., Disp: , Rfl:  .  Misc Natural Products (OSTEO BI-FLEX ADV DOUBLE ST PO), Take by mouth every morning., Disp: , Rfl:  .  multivitamin-iron-minerals-folic acid (CENTRUM) chewable tablet, Chew 1 tablet by mouth daily., Disp: , Rfl:  .  pantoprazole (PROTONIX) 20 MG tablet, Take 1 tablet (20 mg total) by mouth daily., Disp:  90 tablet, Rfl: 3   Family History: family history includes Food Allergy in her mother.    Social History:   reports that she has never smoked. She has never used smokeless tobacco. She reports that she does not drink alcohol and does not use drugs.   Review of Systems: Constitutional: Denies weight loss/weight gain  Eyes: No changes in vision. ENT: No oral lesions, sore throat.  GI: see HPI.  Heme/Lymph: No easy bruising.  CV: No chest pain.  GU: No hematuria.  Integumentary: No rashes.  Neuro: No headaches.  Psych: No depression/anxiety.  Endocrine: No heat/cold intolerance.  Allergic/Immunologic: No urticaria.  Resp: No cough, SOB.  Musculoskeletal: No joint swelling.    Physical Examination: BP 116/79 (BP Location: Right Arm, Patient Position: Sitting, Cuff Size: Large)   Pulse (!) 103   Temp 98.6 F (37 C) (Oral)   Ht 5\' 5"  (1.651 m)   Wt 181 lb 8 oz (82.3 kg)   BMI 30.20 kg/m  Gen: NAD, alert and oriented x 4 HEENT: PEERLA, EOMI, Neck: supple, no JVD Chest: CTA bilaterally, no wheezes, crackles, or other adventitious sounds CV: RRR, no m/g/c/r Abd: soft, NT, ND, +BS in all four quadrants; no HSM, guarding, ridigity, or rebound tenderness Ext: no edema, well perfused with 2+ pulses, Skin: no rash or lesions noted on observed skin Lymph: no noted LAD  Data Reviewed:   No recent labs in Care Everywhere  Assessment/Plan: Julie Hamilton is a 64 y.o. female seen for follow-up of GERD  1.  GERD-well-controlled currently on Protonix 40 mg once a day, will try decreasing to 20 mg once a day since she is doing well.  If develops recurrent symptoms she will contact me and we will increase back to 40 mg.  Compliant with diet modifications.  2.  Dysphagia-rare, history of esophageal stricture.  Discussed possible endoscopy but she would like to hold off at this time, to contact me if symptoms become more frequent or worsen  3. Colon cancer screening - UTD due 2025.  Denies  family history today of colon polyps or colon cancer   Julie Hamilton was seen today for follow-up.  Diagnoses and all orders for this visit:  Chronic GERD  Other orders -     pantoprazole (PROTONIX) 20 MG tablet; Take 1 tablet (20 mg total) by mouth daily.       I personally performed the service, non-incident to. (WP)  Laurine Blazer, [facility]

## 2019-12-14 DIAGNOSIS — Z7984 Long term (current) use of oral hypoglycemic drugs: Secondary | ICD-10-CM | POA: Diagnosis not present

## 2019-12-14 DIAGNOSIS — E1169 Type 2 diabetes mellitus with other specified complication: Secondary | ICD-10-CM | POA: Diagnosis not present

## 2019-12-14 DIAGNOSIS — I10 Essential (primary) hypertension: Secondary | ICD-10-CM | POA: Diagnosis not present

## 2019-12-14 DIAGNOSIS — M5116 Intervertebral disc disorders with radiculopathy, lumbar region: Secondary | ICD-10-CM | POA: Diagnosis not present

## 2020-02-14 DIAGNOSIS — E1169 Type 2 diabetes mellitus with other specified complication: Secondary | ICD-10-CM | POA: Diagnosis not present

## 2020-02-14 DIAGNOSIS — I10 Essential (primary) hypertension: Secondary | ICD-10-CM | POA: Diagnosis not present

## 2020-02-14 DIAGNOSIS — Z7984 Long term (current) use of oral hypoglycemic drugs: Secondary | ICD-10-CM | POA: Diagnosis not present

## 2020-02-14 DIAGNOSIS — M5116 Intervertebral disc disorders with radiculopathy, lumbar region: Secondary | ICD-10-CM | POA: Diagnosis not present

## 2020-02-27 DIAGNOSIS — E1169 Type 2 diabetes mellitus with other specified complication: Secondary | ICD-10-CM | POA: Diagnosis not present

## 2020-02-27 DIAGNOSIS — I10 Essential (primary) hypertension: Secondary | ICD-10-CM | POA: Diagnosis not present

## 2020-02-27 DIAGNOSIS — D519 Vitamin B12 deficiency anemia, unspecified: Secondary | ICD-10-CM | POA: Diagnosis not present

## 2020-02-27 DIAGNOSIS — R5382 Chronic fatigue, unspecified: Secondary | ICD-10-CM | POA: Diagnosis not present

## 2020-02-27 DIAGNOSIS — S93401A Sprain of unspecified ligament of right ankle, initial encounter: Secondary | ICD-10-CM | POA: Diagnosis not present

## 2020-03-01 DIAGNOSIS — Z6830 Body mass index (BMI) 30.0-30.9, adult: Secondary | ICD-10-CM | POA: Diagnosis not present

## 2020-03-15 DIAGNOSIS — Z7984 Long term (current) use of oral hypoglycemic drugs: Secondary | ICD-10-CM | POA: Diagnosis not present

## 2020-03-15 DIAGNOSIS — I10 Essential (primary) hypertension: Secondary | ICD-10-CM | POA: Diagnosis not present

## 2020-03-15 DIAGNOSIS — E1169 Type 2 diabetes mellitus with other specified complication: Secondary | ICD-10-CM | POA: Diagnosis not present

## 2020-03-15 DIAGNOSIS — M5116 Intervertebral disc disorders with radiculopathy, lumbar region: Secondary | ICD-10-CM | POA: Diagnosis not present

## 2020-03-19 DIAGNOSIS — Z85828 Personal history of other malignant neoplasm of skin: Secondary | ICD-10-CM | POA: Diagnosis not present

## 2020-03-19 DIAGNOSIS — L501 Idiopathic urticaria: Secondary | ICD-10-CM | POA: Diagnosis not present

## 2020-03-19 DIAGNOSIS — L570 Actinic keratosis: Secondary | ICD-10-CM | POA: Diagnosis not present

## 2020-04-06 DIAGNOSIS — Z20828 Contact with and (suspected) exposure to other viral communicable diseases: Secondary | ICD-10-CM | POA: Diagnosis not present

## 2020-04-14 DIAGNOSIS — I10 Essential (primary) hypertension: Secondary | ICD-10-CM | POA: Diagnosis not present

## 2020-04-14 DIAGNOSIS — E1169 Type 2 diabetes mellitus with other specified complication: Secondary | ICD-10-CM | POA: Diagnosis not present

## 2020-04-14 DIAGNOSIS — M5116 Intervertebral disc disorders with radiculopathy, lumbar region: Secondary | ICD-10-CM | POA: Diagnosis not present

## 2020-04-14 DIAGNOSIS — Z7984 Long term (current) use of oral hypoglycemic drugs: Secondary | ICD-10-CM | POA: Diagnosis not present

## 2020-06-15 DIAGNOSIS — E1169 Type 2 diabetes mellitus with other specified complication: Secondary | ICD-10-CM | POA: Diagnosis not present

## 2020-06-15 DIAGNOSIS — Z7984 Long term (current) use of oral hypoglycemic drugs: Secondary | ICD-10-CM | POA: Diagnosis not present

## 2020-06-15 DIAGNOSIS — I10 Essential (primary) hypertension: Secondary | ICD-10-CM | POA: Diagnosis not present

## 2020-07-14 DIAGNOSIS — Z7984 Long term (current) use of oral hypoglycemic drugs: Secondary | ICD-10-CM | POA: Diagnosis not present

## 2020-07-14 DIAGNOSIS — M5116 Intervertebral disc disorders with radiculopathy, lumbar region: Secondary | ICD-10-CM | POA: Diagnosis not present

## 2020-07-14 DIAGNOSIS — I10 Essential (primary) hypertension: Secondary | ICD-10-CM | POA: Diagnosis not present

## 2020-07-14 DIAGNOSIS — E1169 Type 2 diabetes mellitus with other specified complication: Secondary | ICD-10-CM | POA: Diagnosis not present

## 2020-08-13 DIAGNOSIS — M5116 Intervertebral disc disorders with radiculopathy, lumbar region: Secondary | ICD-10-CM | POA: Diagnosis not present

## 2020-08-13 DIAGNOSIS — I10 Essential (primary) hypertension: Secondary | ICD-10-CM | POA: Diagnosis not present

## 2020-08-13 DIAGNOSIS — Z7984 Long term (current) use of oral hypoglycemic drugs: Secondary | ICD-10-CM | POA: Diagnosis not present

## 2020-08-13 DIAGNOSIS — E1169 Type 2 diabetes mellitus with other specified complication: Secondary | ICD-10-CM | POA: Diagnosis not present

## 2020-08-23 DIAGNOSIS — E1169 Type 2 diabetes mellitus with other specified complication: Secondary | ICD-10-CM | POA: Diagnosis not present

## 2020-08-23 DIAGNOSIS — R7301 Impaired fasting glucose: Secondary | ICD-10-CM | POA: Diagnosis not present

## 2020-08-23 DIAGNOSIS — K219 Gastro-esophageal reflux disease without esophagitis: Secondary | ICD-10-CM | POA: Diagnosis not present

## 2020-08-23 DIAGNOSIS — I10 Essential (primary) hypertension: Secondary | ICD-10-CM | POA: Diagnosis not present

## 2020-08-27 DIAGNOSIS — K219 Gastro-esophageal reflux disease without esophagitis: Secondary | ICD-10-CM | POA: Diagnosis not present

## 2020-08-27 DIAGNOSIS — I10 Essential (primary) hypertension: Secondary | ICD-10-CM | POA: Diagnosis not present

## 2020-08-27 DIAGNOSIS — M5116 Intervertebral disc disorders with radiculopathy, lumbar region: Secondary | ICD-10-CM | POA: Diagnosis not present

## 2020-08-27 DIAGNOSIS — E1169 Type 2 diabetes mellitus with other specified complication: Secondary | ICD-10-CM | POA: Diagnosis not present

## 2020-09-12 DIAGNOSIS — I10 Essential (primary) hypertension: Secondary | ICD-10-CM | POA: Diagnosis not present

## 2020-09-12 DIAGNOSIS — E1169 Type 2 diabetes mellitus with other specified complication: Secondary | ICD-10-CM | POA: Diagnosis not present

## 2020-09-12 DIAGNOSIS — Z7984 Long term (current) use of oral hypoglycemic drugs: Secondary | ICD-10-CM | POA: Diagnosis not present

## 2020-09-12 DIAGNOSIS — M5116 Intervertebral disc disorders with radiculopathy, lumbar region: Secondary | ICD-10-CM | POA: Diagnosis not present

## 2020-09-17 DIAGNOSIS — L570 Actinic keratosis: Secondary | ICD-10-CM | POA: Diagnosis not present

## 2020-09-17 DIAGNOSIS — Z85828 Personal history of other malignant neoplasm of skin: Secondary | ICD-10-CM | POA: Diagnosis not present

## 2020-09-17 DIAGNOSIS — L821 Other seborrheic keratosis: Secondary | ICD-10-CM | POA: Diagnosis not present

## 2020-10-09 DIAGNOSIS — Z1231 Encounter for screening mammogram for malignant neoplasm of breast: Secondary | ICD-10-CM | POA: Diagnosis not present

## 2020-10-13 DIAGNOSIS — I10 Essential (primary) hypertension: Secondary | ICD-10-CM | POA: Diagnosis not present

## 2020-10-13 DIAGNOSIS — E1169 Type 2 diabetes mellitus with other specified complication: Secondary | ICD-10-CM | POA: Diagnosis not present

## 2020-10-13 DIAGNOSIS — Z7984 Long term (current) use of oral hypoglycemic drugs: Secondary | ICD-10-CM | POA: Diagnosis not present

## 2020-10-13 DIAGNOSIS — M5116 Intervertebral disc disorders with radiculopathy, lumbar region: Secondary | ICD-10-CM | POA: Diagnosis not present

## 2020-11-02 ENCOUNTER — Other Ambulatory Visit (INDEPENDENT_AMBULATORY_CARE_PROVIDER_SITE_OTHER): Payer: Self-pay

## 2020-11-02 MED ORDER — PANTOPRAZOLE SODIUM 20 MG PO TBEC: 20.0000 mg | DELAYED_RELEASE_TABLET | Freq: Every day | ORAL | 3 refills | Status: DC

## 2020-11-12 DIAGNOSIS — M5116 Intervertebral disc disorders with radiculopathy, lumbar region: Secondary | ICD-10-CM | POA: Diagnosis not present

## 2020-11-12 DIAGNOSIS — E1169 Type 2 diabetes mellitus with other specified complication: Secondary | ICD-10-CM | POA: Diagnosis not present

## 2020-11-12 DIAGNOSIS — Z7984 Long term (current) use of oral hypoglycemic drugs: Secondary | ICD-10-CM | POA: Diagnosis not present

## 2020-11-12 DIAGNOSIS — I10 Essential (primary) hypertension: Secondary | ICD-10-CM | POA: Diagnosis not present

## 2020-11-26 ENCOUNTER — Telehealth (INDEPENDENT_AMBULATORY_CARE_PROVIDER_SITE_OTHER): Payer: Self-pay

## 2020-11-26 ENCOUNTER — Encounter (INDEPENDENT_AMBULATORY_CARE_PROVIDER_SITE_OTHER): Payer: Self-pay | Admitting: Gastroenterology

## 2020-11-26 ENCOUNTER — Other Ambulatory Visit (INDEPENDENT_AMBULATORY_CARE_PROVIDER_SITE_OTHER): Payer: Self-pay

## 2020-11-26 ENCOUNTER — Encounter (INDEPENDENT_AMBULATORY_CARE_PROVIDER_SITE_OTHER): Payer: Self-pay

## 2020-11-26 ENCOUNTER — Other Ambulatory Visit: Payer: Self-pay

## 2020-11-26 ENCOUNTER — Ambulatory Visit (INDEPENDENT_AMBULATORY_CARE_PROVIDER_SITE_OTHER): Payer: Medicare Other | Admitting: Gastroenterology

## 2020-11-26 VITALS — BP 133/82 | HR 89 | Temp 98.7°F | Ht 65.0 in | Wt 180.0 lb

## 2020-11-26 DIAGNOSIS — R1319 Other dysphagia: Secondary | ICD-10-CM

## 2020-11-26 DIAGNOSIS — Z01812 Encounter for preprocedural laboratory examination: Secondary | ICD-10-CM

## 2020-11-26 NOTE — Patient Instructions (Signed)
Schedule EGD with ED

## 2020-11-26 NOTE — H&P (View-Only) (Signed)
Julie Hamilton, M.D. Gastroenterology & Hepatology [facility] [address] [place], [place] [address]  Primary Care Physician: [name], MD [place] [id number]  I will communicate my assessment and recommendations to the referring MD via EMR.  Problems: Dysphagia History of erosive esophagitis and stricture GERD  History of Present Illness: Julie Hamilton is a 65 y.o. female with past medical history of GERD, erosive esophagitis, esophageal stricture, diabetes, GERD, hypertension and hyperlipidemia, who presents for follow up of dysphagia.  The patient was last seen on 11/24/2019. At that time, the patient was continued on PPI and was advised to call back if her dysphagia were to worsen.  Patient reports that she has noticed that for the last month, when she eats chicken and beef and she does not chew well/cut the meat in small pieces, she presents episodes of "food getting stuck" in the middle of her chest. In fact, two times she had episodes in which she has to induce vomiting as the food did not go down to her stomach. Denies any heartburn or odynophagia. She takes pantoprazole 20 mg qday in AM for management of GERD- waits 30 minutes to have breakfast.  No previous food impaction episodes.  The patient denies having any nausea, vomiting, fever, chills, hematochezia, melena, hematemesis, abdominal distention, abdominal pain, diarrhea, jaundice, pruritus or weight loss.  Last EGD:2016  Erosive esophagitis along with stricture at GE junction. Small sliding hiatal hernia. 5 mm ulcer at gastric body along the posterior wall. Biopsies taken. 5 mm hyperplastic appearing polyp just below the level of hiatus. This polyp was left alone. Esophagus dilated by passing 54 French Maloney dilator resulting in mucosal disruption at [employer]. Last Colonoscopy: 2015 - normal  Past Medical History: Past Medical  History:  Diagnosis Date   Angio-edema    Diabetes ([place])    x 4 yrs prediabetic   GERD (gastroesophageal reflux disease)    High cholesterol    Hypertension    Urticaria     Past Surgical History: Past Surgical History:  Procedure Laterality Date   ESOPHAGEAL DILATION Bilateral 05/30/2015   Procedure: ESOPHAGEAL DILATION;  Surgeon: Rogene Houston, MD;  [facility];  Service: Endoscopy;  Laterality: Bilateral;   ESOPHAGOGASTRODUODENOSCOPY N/A 05/30/2015   Procedure: ESOPHAGOGASTRODUODENOSCOPY (EGD);  Surgeon: Rogene Houston, MD;  [facility];  Service: Endoscopy;  Laterality: N/A;  10:30   TUBAL LIGATION      Family History: Family History  Problem Relation Age of Onset   Food Allergy Mother        shellfish    Social History: Social History   Tobacco Use  Smoking Status Never  Smokeless Tobacco Never   Social History   Substance and Sexual Activity  Alcohol Use No   Alcohol/week: 0.0 standard drinks   Social History   Substance and Sexual Activity  Drug Use No    Allergies: Allergies  Allergen Reactions   Milk-Related Compounds Anaphylaxis   Penicillins Hives   Prednisone Other (See Comments)    Flushing of her face.    Almond (Diagnostic) Rash   Cashew Nut Oil Rash   Strawberry (Diagnostic) Rash    Medications: Current Outpatient Medications  Medication Sig Dispense Refill   Calcium-Vitamin D (CALTRATE 600 PLUS-VIT D PO) Take by mouth in the morning and at bedtime.      cetirizine (ZYRTEC) 10 MG tablet Take 10 mg by mouth 2 (two) times  daily.     EPINEPHrine 0.3 mg/0.3 mL IJ SOAJ injection Inject 0.3 mLs (0.3 mg total) into the muscle once as needed for anaphylaxis. 2 each 1   hydrochlorothiazide (HYDRODIURIL) 12.5 MG tablet Take 12.5 mg by mouth daily.     lisinopril (ZESTRIL) 10 MG tablet Take 10 mg by mouth daily.     lovastatin (MEVACOR) 20 MG tablet Take 20 mg by mouth at bedtime.     metFORMIN (GLUCOPHAGE-XR) 500 MG  24 hr tablet Take 500 mg by mouth 2 (two) times daily.     Misc Natural Products (OSTEO BI-FLEX ADV DOUBLE ST PO) Take by mouth every morning.     multivitamin-iron-minerals-folic acid (CENTRUM) chewable tablet Chew 1 tablet by mouth daily.     pantoprazole (PROTONIX) 20 MG tablet Take 1 tablet (20 mg total) by mouth daily. 90 tablet 3   No current facility-administered medications for this visit.    Review of Systems: GENERAL: negative for malaise, night sweats HEENT: No changes in hearing or vision, no nose bleeds or other nasal problems. NECK: Negative for lumps, goiter, pain and significant neck swelling RESPIRATORY: Negative for cough, wheezing CARDIOVASCULAR: Negative for chest pain, leg swelling, palpitations, orthopnea GI: SEE HPI MUSCULOSKELETAL: Negative for joint pain or swelling, back pain, and muscle pain. SKIN: Negative for lesions, rash PSYCH: Negative for sleep disturbance, mood disorder and recent psychosocial stressors. HEMATOLOGY Negative for prolonged bleeding, bruising easily, and swollen nodes. ENDOCRINE: Negative for cold or heat intolerance, polyuria, polydipsia and goiter. NEURO: negative for tremor, gait imbalance, syncope and seizures. The remainder of the review of systems is noncontributory.   Physical Exam: BP 133/82 (BP Location: Right Arm, Patient Position: Sitting, Cuff Size: Large)   Pulse 89   Temp 98.7 F (37.1 C) (Oral)   Ht 5\' 5"  (1.651 m)   Wt 180 lb (81.6 kg)   BMI 29.95 kg/m  GENERAL: The patient is AO x3, in no acute distress. HEENT: Head is normocephalic and atraumatic. EOMI are intact. Mouth is well hydrated and without lesions. NECK: Supple. No masses LUNGS: Clear to auscultation. No presence of rhonchi/wheezing/rales. Adequate chest expansion HEART: RRR, normal s1 and s2. ABDOMEN: Soft, nontender, no guarding, no peritoneal signs, and nondistended. BS +. No masses. EXTREMITIES: Without any cyanosis, clubbing, rash, lesions or  edema. NEUROLOGIC: AOx3, no focal motor deficit. SKIN: no jaundice, no rashes  Imaging/Labs: as above  I personally reviewed and interpreted the available labs, imaging and endoscopic files.  Impression and Plan: Julie Hamilton is a 65 y.o. female with past medical history of GERD, erosive esophagitis, esophageal stricture, diabetes, GERD, hypertension and hyperlipidemia, who presents for follow up of dysphagia.  The patient has presented progressive symptoms of dysphagia.  I explained to the patient that it is very likely her peptic strictures have recurred, for which we will need to evaluate this further with an EGD and possible dilation.  Other potential causes for her dysphagia such as malignancy or GERD will be evaluated at that time.  She will be continued on her current dose of PPI as it seems her symptoms of GERD are well controlled on 20 mg every day.  Patient understood and agreed.  - Schedule EGD with ED - Continue omeprazole 20 mg qday  All questions were answered.      Harvel Quale, MD Gastroenterology and Hepatology [facility]

## 2020-11-26 NOTE — Telephone Encounter (Signed)
Julie Hamilton, CMA  

## 2020-11-26 NOTE — Progress Notes (Signed)
Julie Hamilton, M.D. Gastroenterology & Hepatology [facility] [address] [place], [place] [address]  Primary Care Physician: [name], [place] [id number]  I will communicate my assessment and recommendations to the referring MD via EMR.  Problems: Dysphagia History of erosive esophagitis and stricture GERD  History of Present Illness: Julie Hamilton is a 65 y.o. female with past medical history of GERD, erosive esophagitis, esophageal stricture, diabetes, GERD, hypertension and hyperlipidemia, who presents for follow up of dysphagia.  The patient was last seen on 11/24/2019. At that time, the patient was continued on PPI and was advised to call back if her dysphagia were to worsen.  Patient reports that she has noticed that for the last month, when she eats chicken and beef and she does not chew well/cut the meat in small pieces, she presents episodes of "food getting stuck" in the middle of her chest. In fact, two times she had episodes in which she has to induce vomiting as the food did not go down to her stomach. Denies any heartburn or odynophagia. She takes pantoprazole 20 mg qday in AM for management of GERD- waits 30 minutes to have breakfast.  No previous food impaction episodes.  The patient denies having any nausea, vomiting, fever, chills, hematochezia, melena, hematemesis, abdominal distention, abdominal pain, diarrhea, jaundice, pruritus or weight loss.  Last EGD:2016  Erosive esophagitis along with stricture at GE junction. Small sliding hiatal hernia. 5 mm ulcer at gastric body along the posterior wall. Biopsies taken. 5 mm hyperplastic appearing polyp just below the level of hiatus. This polyp was left alone. Esophagus dilated by passing 54 French Maloney dilator resulting in mucosal disruption at [employer]. Last Colonoscopy: 2015 - normal  Past Medical History: Past Medical  History:  Diagnosis Date   Angio-edema    Diabetes ([place])    x 4 yrs prediabetic   GERD (gastroesophageal reflux disease)    High cholesterol    Hypertension    Urticaria     Past Surgical History: Past Surgical History:  Procedure Laterality Date   ESOPHAGEAL DILATION Bilateral 05/30/2015   Procedure: ESOPHAGEAL DILATION;  Surgeon: Rogene Houston, MD;  [facility]: [facility];  Service: Endoscopy;  Laterality: Bilateral;   ESOPHAGOGASTRODUODENOSCOPY N/A 05/30/2015   Procedure: ESOPHAGOGASTRODUODENOSCOPY (EGD);  Surgeon: Rogene Houston, MD;  Location: [facility];  Service: Endoscopy;  Laterality: N/A;  10:30   TUBAL LIGATION      Family History: Family History  Problem Relation Age of Onset   Food Allergy Mother        shellfish    Social History: Social History   Tobacco Use  Smoking Status Never  Smokeless Tobacco Never   Social History   Substance and Sexual Activity  Alcohol Use No   Alcohol/week: 0.0 standard drinks   Social History   Substance and Sexual Activity  Drug Use No    Allergies: Allergies  Allergen Reactions   Milk-Related Compounds Anaphylaxis   Penicillins Hives   Prednisone Other (See Comments)    Flushing of her face.    Almond (Diagnostic) Rash   Cashew Nut Oil Rash   Strawberry (Diagnostic) Rash    Medications: Current Outpatient Medications  Medication Sig Dispense Refill   Calcium-Vitamin D (CALTRATE 600 PLUS-VIT D PO) Take by mouth in the morning and at bedtime.      cetirizine (ZYRTEC) 10 MG tablet Take 10 mg by mouth 2 (two) times  daily.     EPINEPHrine 0.3 mg/0.3 mL IJ SOAJ injection Inject 0.3 mLs (0.3 mg total) into the muscle once as needed for anaphylaxis. 2 each 1   hydrochlorothiazide (HYDRODIURIL) 12.5 MG tablet Take 12.5 mg by mouth daily.     lisinopril (ZESTRIL) 10 MG tablet Take 10 mg by mouth daily.     lovastatin (MEVACOR) 20 MG tablet Take 20 mg by mouth at bedtime.     metFORMIN (GLUCOPHAGE-XR) 500 MG  24 hr tablet Take 500 mg by mouth 2 (two) times daily.     Misc Natural Products (OSTEO BI-FLEX ADV DOUBLE ST PO) Take by mouth every morning.     multivitamin-iron-minerals-folic acid (CENTRUM) chewable tablet Chew 1 tablet by mouth daily.     pantoprazole (PROTONIX) 20 MG tablet Take 1 tablet (20 mg total) by mouth daily. 90 tablet 3   No current facility-administered medications for this visit.    Review of Systems: GENERAL: negative for malaise, night sweats HEENT: No changes in hearing or vision, no nose bleeds or other nasal problems. NECK: Negative for lumps, goiter, pain and significant neck swelling RESPIRATORY: Negative for cough, wheezing CARDIOVASCULAR: Negative for chest pain, leg swelling, palpitations, orthopnea GI: SEE HPI MUSCULOSKELETAL: Negative for joint pain or swelling, back pain, and muscle pain. SKIN: Negative for lesions, rash PSYCH: Negative for sleep disturbance, mood disorder and recent psychosocial stressors. HEMATOLOGY Negative for prolonged bleeding, bruising easily, and swollen nodes. ENDOCRINE: Negative for cold or heat intolerance, polyuria, polydipsia and goiter. NEURO: negative for tremor, gait imbalance, syncope and seizures. The remainder of the review of systems is noncontributory.   Physical Exam: BP 133/82 (BP Location: Right Arm, Patient Position: Sitting, Cuff Size: Large)   Pulse 89   Temp 98.7 F (37.1 C) (Oral)   Ht 5\' 5"  (1.651 m)   Wt 180 lb (81.6 kg)   BMI 29.95 kg/m  GENERAL: The patient is AO x3, in no acute distress. HEENT: Head is normocephalic and atraumatic. EOMI are intact. Mouth is well hydrated and without lesions. NECK: Supple. No masses LUNGS: Clear to auscultation. No presence of rhonchi/wheezing/rales. Adequate chest expansion HEART: RRR, normal s1 and s2. ABDOMEN: Soft, nontender, no guarding, no peritoneal signs, and nondistended. BS +. No masses. EXTREMITIES: Without any cyanosis, clubbing, rash, lesions or  edema. NEUROLOGIC: AOx3, no focal motor deficit. SKIN: no jaundice, no rashes  Imaging/Labs: as above  I personally reviewed and interpreted the available labs, imaging and endoscopic files.  Impression and Plan: Julie Hamilton is a 65 y.o. female with past medical history of GERD, erosive esophagitis, esophageal stricture, diabetes, GERD, hypertension and hyperlipidemia, who presents for follow up of dysphagia.  The patient has presented progressive symptoms of dysphagia.  I explained to the patient that it is very likely her peptic strictures have recurred, for which we will need to evaluate this further with an EGD and possible dilation.  Other potential causes for her dysphagia such as malignancy or GERD will be evaluated at that time.  She will be continued on her current dose of PPI as it seems her symptoms of GERD are well controlled on 20 mg every day.  Patient understood and agreed.  - Schedule EGD with ED - Continue omeprazole 20 mg qday  All questions were answered.      Harvel Quale, MD Gastroenterology and Hepatology [facility]

## 2020-12-14 ENCOUNTER — Other Ambulatory Visit: Payer: Self-pay

## 2020-12-14 ENCOUNTER — Other Ambulatory Visit (HOSPITAL_COMMUNITY)
Admission: RE | Admit: 2020-12-14 | Discharge: 2020-12-14 | Disposition: A | Discharge status: Home / Self Care | Payer: Medicare Other | Source: Ambulatory Visit | Attending: Gastroenterology | Admitting: Gastroenterology

## 2020-12-14 DIAGNOSIS — Z01812 Encounter for preprocedural laboratory examination: Secondary | ICD-10-CM | POA: Diagnosis not present

## 2020-12-14 LAB — BASIC METABOLIC PANEL
Anion gap: 10 (ref 5–15)
BUN: 13 mg/dL (ref 8–23)
CO2: 25 mmol/L (ref 22–32)
Calcium: 9.4 mg/dL (ref 8.9–10.3)
Chloride: 106 mmol/L (ref 98–111)
Creatinine, Ser: 0.73 mg/dL (ref 0.44–1.00)
GFR, Estimated: >60 mL/min (ref >60)
Glucose, Bld: 110 mg/dL — ABNORMAL HIGH (ref 70–99)
Potassium: 3.8 mmol/L (ref 3.5–5.1)
Sodium: 141 mmol/L (ref 135–145)

## 2020-12-19 ENCOUNTER — Ambulatory Visit (HOSPITAL_COMMUNITY): Payer: Medicare Other | Admitting: Anesthesiology

## 2020-12-19 ENCOUNTER — Encounter (HOSPITAL_COMMUNITY)
Admission: RE | Disposition: A | Discharge status: Home / Self Care | Payer: Self-pay | Source: Home / Self Care | Attending: Gastroenterology

## 2020-12-19 ENCOUNTER — Encounter (HOSPITAL_COMMUNITY): Payer: Self-pay | Admitting: Gastroenterology

## 2020-12-19 ENCOUNTER — Other Ambulatory Visit: Payer: Self-pay

## 2020-12-19 ENCOUNTER — Ambulatory Visit (HOSPITAL_COMMUNITY)
Admission: RE | Admit: 2020-12-19 | Discharge: 2020-12-19 | Disposition: A | Discharge status: Home / Self Care | Payer: Medicare Other | Attending: Gastroenterology | Admitting: Gastroenterology

## 2020-12-19 DIAGNOSIS — K219 Gastro-esophageal reflux disease without esophagitis: Secondary | ICD-10-CM | POA: Diagnosis not present

## 2020-12-19 DIAGNOSIS — Z888 Allergy status to other drugs, medicaments and biological substances status: Secondary | ICD-10-CM | POA: Insufficient documentation

## 2020-12-19 DIAGNOSIS — Z91011 Allergy to milk products: Secondary | ICD-10-CM | POA: Insufficient documentation

## 2020-12-19 DIAGNOSIS — Z91018 Allergy to other foods: Secondary | ICD-10-CM | POA: Diagnosis not present

## 2020-12-19 DIAGNOSIS — Z880 Allergy status to penicillin: Secondary | ICD-10-CM | POA: Diagnosis not present

## 2020-12-19 DIAGNOSIS — K449 Diaphragmatic hernia without obstruction or gangrene: Secondary | ICD-10-CM | POA: Insufficient documentation

## 2020-12-19 DIAGNOSIS — K222 Esophageal obstruction: Secondary | ICD-10-CM | POA: Insufficient documentation

## 2020-12-19 DIAGNOSIS — Z7984 Long term (current) use of oral hypoglycemic drugs: Secondary | ICD-10-CM | POA: Insufficient documentation

## 2020-12-19 DIAGNOSIS — K2289 Other specified disease of esophagus: Secondary | ICD-10-CM | POA: Diagnosis not present

## 2020-12-19 DIAGNOSIS — Z79899 Other long term (current) drug therapy: Secondary | ICD-10-CM | POA: Insufficient documentation

## 2020-12-19 DIAGNOSIS — E119 Type 2 diabetes mellitus without complications: Secondary | ICD-10-CM | POA: Diagnosis not present

## 2020-12-19 DIAGNOSIS — R1310 Dysphagia, unspecified: Secondary | ICD-10-CM | POA: Diagnosis not present

## 2020-12-19 HISTORY — PX: BIOPSY: SHX5522

## 2020-12-19 HISTORY — PX: ESOPHAGEAL DILATION: SHX303

## 2020-12-19 HISTORY — PX: ESOPHAGOGASTRODUODENOSCOPY (EGD) WITH PROPOFOL: SHX5813

## 2020-12-19 LAB — GLUCOSE, CAPILLARY: Glucose-Capillary: 90 mg/dL (ref 70–99)

## 2020-12-19 SURGERY — ESOPHAGOGASTRODUODENOSCOPY (EGD) WITH PROPOFOL
Anesthesia: General

## 2020-12-19 MED ORDER — STERILE WATER FOR IRRIGATION IR SOLN
Status: DC | PRN
  Administered 2020-12-19: 100 mL

## 2020-12-19 MED ORDER — LACTATED RINGERS IV SOLN: INTRAVENOUS | Status: DC

## 2020-12-19 MED ORDER — PROPOFOL 500 MG/50ML IV EMUL
INTRAVENOUS | Status: DC | PRN
  Administered 2020-12-19: 100 ug/kg/min via INTRAVENOUS
  Administered 2020-12-19: 150 ug/kg/min via INTRAVENOUS

## 2020-12-19 MED ORDER — LIDOCAINE HCL (CARDIAC) PF 100 MG/5ML IV SOSY
PREFILLED_SYRINGE | INTRAVENOUS | Status: DC | PRN
  Administered 2020-12-19: 50 mg via INTRAVENOUS

## 2020-12-19 MED ORDER — PROPOFOL 10 MG/ML IV BOLUS
INTRAVENOUS | Status: DC | PRN
  Administered 2020-12-19: 100 mg via INTRAVENOUS

## 2020-12-19 NOTE — Discharge Instructions (Signed)
You are being discharged to home.  Resume your previous diet.  We are waiting for your pathology results.  Continue omeprazole 20 mg qday indefinitely.

## 2020-12-19 NOTE — Op Note (Signed)
[facility] Patient Name: Julie Hamilton Procedure Date: 12/19/2020 1:47 PM MRN: [id number] Date of Birth: 08/09/1955 Attending MD: Maylon Peppers ,  CSN: [id number] Age: 65 Admit Type: Outpatient Procedure:                Upper GI endoscopy Indications:              Dysphagia Providers:                Maylon Peppers, Lambert Mody Aram Candela Referring MD:              Medicines:                Monitored Anesthesia Care Complications:            No immediate complications. Estimated Blood Loss:     Estimated blood loss: none. Procedure:                Pre-Anesthesia Assessment:                           - Prior to the procedure, a History and Physical                            was performed, and patient medications, allergies                            and sensitivities were reviewed. The patient's                            tolerance of previous anesthesia was reviewed.                           - The risks and benefits of the procedure and the                            sedation options and risks were discussed with the                            patient. All questions were answered and informed                            consent was obtained.                           - ASA Grade Assessment: II - A patient with mild                            systemic disease.                           After obtaining informed consent, the endoscope was                            passed under direct vision. Throughout the                            procedure, the patient's blood pressure, pulse, and  oxygen saturations were monitored continuously. The                            GIF-H190 ([id number]) scope was introduced through the                            mouth, and advanced to the second part of duodenum.                            The upper GI endoscopy was accomplished without                            difficulty. The patient tolerated the procedure                             well. Scope In: 2:03:55 PM Scope Out: 2:16:21 PM Total Procedure Duration: 0 hours 12 minutes 26 seconds  Findings:      The examined esophagus was normal. Biopsies were obtained from the       proximal and distal esophagus with cold forceps for histology of       suspected eosinophilic esophagitis.      A non-obstructing Schatzki ring was found at the gastroesophageal       junction. The ring was initially disrupted with a cold forcepts. A TTS       dilator was passed through the scope. Dilation with a 15-16.5-18 mm       balloon dilator was performed to 18 mm.      The entire examined stomach was normal.      The examined duodenum was normal. Impression:               - Normal esophagus. Biopsied.                           - Non-obstructing Schatzki ring. Dilated.                           - Normal stomach.                           - Normal examined duodenum.                           - Continue omeprazole 20 mg qday indefinitely. Moderate Sedation:      Per Anesthesia Care Recommendation:           - Discharge patient to home (ambulatory).                           - Resume previous diet.                           - Await pathology results. Procedure Code(s):        --- Professional ---                           [phone], Esophagogastroduodenoscopy, flexible,  transoral; with transendoscopic balloon dilation of                            esophagus (less than 30 mm diameter)                           43239, 59, Esophagogastroduodenoscopy, flexible,                            transoral; with biopsy, single or multiple Diagnosis Code(s):        --- Professional ---                           K22.2, Esophageal obstruction                           R13.10, Dysphagia, unspecified CPT copyright 2019 American Medical Association. All rights reserved. The codes documented in this report are preliminary and upon coder review may  be revised  to meet current compliance requirements. Maylon Peppers, MD Maylon Peppers,  12/19/2020 2:23:08 PM This report has been signed electronically. Number of Addenda: 0

## 2020-12-19 NOTE — Interval H&P Note (Signed)
History and Physical Interval Note:  12/19/2020 1:16 PM Julie Hamilton is a 65 y.o. female with past medical history of GERD, erosive esophagitis, esophageal stricture, diabetes, GERD, hypertension and hyperlipidemia, who presents for follow up of dysphagia.  Patient states that since the last time she was seen in clinic she has very rarely presented any episodes of dysphagia.  Denies having any nausea, vomiting, fever, chills.  She has been taking her PPI compliantly.  BP 135/64   Temp 98.3 F (36.8 C) (Oral)   Resp 16   Ht 5\' 5"  (1.651 m)   Wt 81.6 kg   SpO2 96%   BMI 29.95 kg/m  GENERAL: The patient is AO x3, in no acute distress. HEENT: Head is normocephalic and atraumatic. EOMI are intact. Mouth is well hydrated and without lesions. NECK: Supple. No masses LUNGS: Clear to auscultation. No presence of rhonchi/wheezing/rales. Adequate chest expansion HEART: RRR, normal s1 and s2. ABDOMEN: Soft, nontender, no guarding, no peritoneal signs, and nondistended. BS +. No masses. EXTREMITIES: Without any cyanosis, clubbing, rash, lesions or edema. NEUROLOGIC: AOx3, no focal motor deficit. SKIN: no jaundice, no rashes   [name]  has presented today for surgery, with the diagnosis of Dysphagia.  The various methods of treatment have been discussed with the patient and family. After consideration of risks, benefits and other options for treatment, the patient has consented to  Procedure(s) with comments: ESOPHAGOGASTRODUODENOSCOPY (EGD) WITH PROPOFOL (N/A) - 2:10 ESOPHAGEAL DILATION (N/A) as a surgical intervention.  The patient's history has been reviewed, patient examined, no change in status, stable for surgery.  I have reviewed the patient's chart and labs.  Questions were answered to the patient's satisfaction.     [name]

## 2020-12-19 NOTE — Anesthesia Postprocedure Evaluation (Signed)
Anesthesia Post Note  Patient: Julie Hamilton  Procedure(s) Performed: ESOPHAGOGASTRODUODENOSCOPY (EGD) WITH PROPOFOL ESOPHAGEAL DILATION BIOPSY  Patient location during evaluation: Endoscopy Anesthesia Type: General Level of consciousness: awake and alert and oriented Pain management: pain level controlled Vital Signs Assessment: post-procedure vital signs reviewed and stable Respiratory status: spontaneous breathing and respiratory function stable Cardiovascular status: blood pressure returned to baseline and stable Postop Assessment: no apparent nausea or vomiting Anesthetic complications: no   No notable events documented.   Last Vitals:  Vitals:   12/19/20 1237 12/19/20 1421  BP: 135/64 (!) 108/47  Pulse:  80  Resp: 16 (!) 22  Temp: 36.8 C (!) 36.4 C  SpO2: 96% 96%    Last Pain:  Vitals:   12/19/20 1421  TempSrc: Oral  PainSc: 0-No pain                 Bena Kobel C Rilda Bulls

## 2020-12-19 NOTE — Anesthesia Procedure Notes (Signed)
Date/Time: 12/19/2020 2:06 PM Performed by: Orlie Dakin, CRNA Pre-anesthesia Checklist: Patient identified, Emergency Drugs available, Suction available and Patient being monitored Patient Re-evaluated:Patient Re-evaluated prior to induction Oxygen Delivery Method: Nasal cannula Induction Type: IV induction Placement Confirmation: positive ETCO2

## 2020-12-19 NOTE — Transfer of Care (Signed)
Immediate Anesthesia Transfer of Care Note  Patient: Julie Hamilton  Procedure(s) Performed: ESOPHAGOGASTRODUODENOSCOPY (EGD) WITH PROPOFOL ESOPHAGEAL DILATION BIOPSY  Patient Location: Endoscopy Unit  Anesthesia Type:General  Level of Consciousness: awake  Airway & Oxygen Therapy: Patient Spontanous Breathing  Post-op Assessment: Report given to RN and Post -op Vital signs reviewed and stable  Post vital signs: Reviewed and stable  Last Vitals:  Vitals Value Taken Time  BP 108/47 12/19/20 1421  Temp 36.4 C 12/19/20 1421  Pulse 80 12/19/20 1421  Resp 22 12/19/20 1421  SpO2 96 % 12/19/20 1421    Last Pain:  Vitals:   12/19/20 1421  TempSrc: Oral  PainSc: 0-No pain      Patients Stated Pain Goal: 5 (98/42/10 3128)  Complications: No notable events documented.

## 2020-12-19 NOTE — Anesthesia Preprocedure Evaluation (Addendum)
Anesthesia Evaluation  Patient identified by MRN, date of birth, ID band Patient awake    Reviewed: Allergy & Precautions, NPO status , Patient's Chart, lab work & pertinent test results  History of Anesthesia Complications Negative for: history of anesthetic complications  Airway Mallampati: II  TM Distance: >3 FB Neck ROM: Full    Dental  (+) Dental Advisory Given, Missing   Pulmonary neg pulmonary ROS,    Pulmonary exam normal breath sounds clear to auscultation       Cardiovascular Exercise Tolerance: Good hypertension, Pt. on medications Normal cardiovascular exam Rhythm:Regular Rate:Normal     Neuro/Psych negative neurological ROS     GI/Hepatic Neg liver ROS, GERD  Medicated and Controlled,  Endo/Other  diabetes (pre)  Renal/GU      Musculoskeletal negative musculoskeletal ROS (+)   Abdominal   Peds  Hematology negative hematology ROS (+)   Anesthesia Other Findings Hypercholesterolemia   Reproductive/Obstetrics negative OB ROS                            Anesthesia Physical Anesthesia Plan  ASA: 2  Anesthesia Plan: General   Post-op Pain Management:    Induction: Intravenous  PONV Risk Score and Plan: Propofol infusion  Airway Management Planned: Nasal Cannula and Natural Airway  Additional Equipment:   Intra-op Plan:   Post-operative Plan:   Informed Consent: I have reviewed the patients History and Physical, chart, labs and discussed the procedure including the risks, benefits and alternatives for the proposed anesthesia with the patient or authorized representative who has indicated his/her understanding and acceptance.     Dental advisory given  Plan Discussed with: CRNA and Surgeon  Anesthesia Plan Comments:        Anesthesia Quick Evaluation

## 2020-12-21 LAB — SURGICAL PATHOLOGY

## 2020-12-26 ENCOUNTER — Encounter (HOSPITAL_COMMUNITY): Payer: Self-pay | Admitting: Gastroenterology

## 2021-02-13 DIAGNOSIS — Z7984 Long term (current) use of oral hypoglycemic drugs: Secondary | ICD-10-CM | POA: Diagnosis not present

## 2021-02-13 DIAGNOSIS — I10 Essential (primary) hypertension: Secondary | ICD-10-CM | POA: Diagnosis not present

## 2021-02-13 DIAGNOSIS — E1169 Type 2 diabetes mellitus with other specified complication: Secondary | ICD-10-CM | POA: Diagnosis not present

## 2021-02-13 DIAGNOSIS — M5116 Intervertebral disc disorders with radiculopathy, lumbar region: Secondary | ICD-10-CM | POA: Diagnosis not present

## 2021-02-22 DIAGNOSIS — I10 Essential (primary) hypertension: Secondary | ICD-10-CM | POA: Diagnosis not present

## 2021-02-22 DIAGNOSIS — R7301 Impaired fasting glucose: Secondary | ICD-10-CM | POA: Diagnosis not present

## 2021-02-22 DIAGNOSIS — K219 Gastro-esophageal reflux disease without esophagitis: Secondary | ICD-10-CM | POA: Diagnosis not present

## 2021-02-22 DIAGNOSIS — E1169 Type 2 diabetes mellitus with other specified complication: Secondary | ICD-10-CM | POA: Diagnosis not present

## 2021-02-27 DIAGNOSIS — I10 Essential (primary) hypertension: Secondary | ICD-10-CM | POA: Diagnosis not present

## 2021-02-27 DIAGNOSIS — E1169 Type 2 diabetes mellitus with other specified complication: Secondary | ICD-10-CM | POA: Diagnosis not present

## 2021-02-27 DIAGNOSIS — M5116 Intervertebral disc disorders with radiculopathy, lumbar region: Secondary | ICD-10-CM | POA: Diagnosis not present

## 2021-02-27 DIAGNOSIS — Z0001 Encounter for general adult medical examination with abnormal findings: Secondary | ICD-10-CM | POA: Diagnosis not present

## 2021-03-18 DIAGNOSIS — D239 Other benign neoplasm of skin, unspecified: Secondary | ICD-10-CM | POA: Diagnosis not present

## 2021-03-18 DIAGNOSIS — L570 Actinic keratosis: Secondary | ICD-10-CM | POA: Diagnosis not present

## 2021-03-18 DIAGNOSIS — Z85828 Personal history of other malignant neoplasm of skin: Secondary | ICD-10-CM | POA: Diagnosis not present

## 2021-05-24 DIAGNOSIS — Z23 Encounter for immunization: Secondary | ICD-10-CM | POA: Diagnosis not present

## 2021-05-30 DIAGNOSIS — Z0120 Encounter for dental examination and cleaning without abnormal findings: Secondary | ICD-10-CM | POA: Diagnosis not present

## 2021-08-27 DIAGNOSIS — R7301 Impaired fasting glucose: Secondary | ICD-10-CM | POA: Diagnosis not present

## 2021-08-27 DIAGNOSIS — K219 Gastro-esophageal reflux disease without esophagitis: Secondary | ICD-10-CM | POA: Diagnosis not present

## 2021-08-27 DIAGNOSIS — I10 Essential (primary) hypertension: Secondary | ICD-10-CM | POA: Diagnosis not present

## 2021-08-27 DIAGNOSIS — E1169 Type 2 diabetes mellitus with other specified complication: Secondary | ICD-10-CM | POA: Diagnosis not present

## 2021-09-03 DIAGNOSIS — M810 Age-related osteoporosis without current pathological fracture: Secondary | ICD-10-CM | POA: Diagnosis not present

## 2021-09-03 DIAGNOSIS — M8589 Other specified disorders of bone density and structure, multiple sites: Secondary | ICD-10-CM | POA: Diagnosis not present

## 2021-09-16 DIAGNOSIS — Z1283 Encounter for screening for malignant neoplasm of skin: Secondary | ICD-10-CM | POA: Diagnosis not present

## 2021-09-16 DIAGNOSIS — L570 Actinic keratosis: Secondary | ICD-10-CM | POA: Diagnosis not present

## 2021-09-16 DIAGNOSIS — Z85828 Personal history of other malignant neoplasm of skin: Secondary | ICD-10-CM | POA: Diagnosis not present

## 2021-10-08 ENCOUNTER — Other Ambulatory Visit (INDEPENDENT_AMBULATORY_CARE_PROVIDER_SITE_OTHER): Payer: Self-pay | Admitting: Gastroenterology

## 2022-01-02 DIAGNOSIS — Z1231 Encounter for screening mammogram for malignant neoplasm of breast: Secondary | ICD-10-CM | POA: Diagnosis not present

## 2022-03-19 DIAGNOSIS — L570 Actinic keratosis: Secondary | ICD-10-CM | POA: Diagnosis not present

## 2022-03-19 DIAGNOSIS — Z1283 Encounter for screening for malignant neoplasm of skin: Secondary | ICD-10-CM | POA: Diagnosis not present

## 2022-03-19 DIAGNOSIS — Z85828 Personal history of other malignant neoplasm of skin: Secondary | ICD-10-CM | POA: Diagnosis not present

## 2022-03-27 DIAGNOSIS — I10 Essential (primary) hypertension: Secondary | ICD-10-CM | POA: Diagnosis not present

## 2022-03-27 DIAGNOSIS — E1169 Type 2 diabetes mellitus with other specified complication: Secondary | ICD-10-CM | POA: Diagnosis not present

## 2022-03-27 DIAGNOSIS — R5382 Chronic fatigue, unspecified: Secondary | ICD-10-CM | POA: Diagnosis not present

## 2022-03-27 DIAGNOSIS — E7849 Other hyperlipidemia: Secondary | ICD-10-CM | POA: Diagnosis not present

## 2022-04-01 DIAGNOSIS — I10 Essential (primary) hypertension: Secondary | ICD-10-CM | POA: Diagnosis not present

## 2022-04-01 DIAGNOSIS — K219 Gastro-esophageal reflux disease without esophagitis: Secondary | ICD-10-CM | POA: Diagnosis not present

## 2022-04-01 DIAGNOSIS — Z0001 Encounter for general adult medical examination with abnormal findings: Secondary | ICD-10-CM | POA: Diagnosis not present

## 2022-04-01 DIAGNOSIS — Z6829 Body mass index (BMI) 29.0-29.9, adult: Secondary | ICD-10-CM | POA: Diagnosis not present

## 2022-04-01 DIAGNOSIS — E1169 Type 2 diabetes mellitus with other specified complication: Secondary | ICD-10-CM | POA: Diagnosis not present

## 2022-04-01 DIAGNOSIS — M5116 Intervertebral disc disorders with radiculopathy, lumbar region: Secondary | ICD-10-CM | POA: Diagnosis not present

## 2022-08-29 DIAGNOSIS — K219 Gastro-esophageal reflux disease without esophagitis: Secondary | ICD-10-CM | POA: Diagnosis not present

## 2022-08-29 DIAGNOSIS — E785 Hyperlipidemia, unspecified: Secondary | ICD-10-CM | POA: Diagnosis not present

## 2022-08-29 DIAGNOSIS — Z7984 Long term (current) use of oral hypoglycemic drugs: Secondary | ICD-10-CM | POA: Diagnosis not present

## 2022-08-29 DIAGNOSIS — I10 Essential (primary) hypertension: Secondary | ICD-10-CM | POA: Diagnosis not present

## 2022-08-29 DIAGNOSIS — S72012A Unspecified intracapsular fracture of left femur, initial encounter for closed fracture: Secondary | ICD-10-CM | POA: Diagnosis not present

## 2022-08-29 DIAGNOSIS — Z79899 Other long term (current) drug therapy: Secondary | ICD-10-CM | POA: Diagnosis not present

## 2022-08-29 DIAGNOSIS — S299XXA Unspecified injury of thorax, initial encounter: Secondary | ICD-10-CM | POA: Diagnosis not present

## 2022-08-29 DIAGNOSIS — M25552 Pain in left hip: Secondary | ICD-10-CM | POA: Diagnosis not present

## 2022-08-29 DIAGNOSIS — W19XXXA Unspecified fall, initial encounter: Secondary | ICD-10-CM | POA: Diagnosis not present

## 2022-08-29 DIAGNOSIS — R7303 Prediabetes: Secondary | ICD-10-CM | POA: Diagnosis not present

## 2022-08-29 DIAGNOSIS — S72002A Fracture of unspecified part of neck of left femur, initial encounter for closed fracture: Secondary | ICD-10-CM | POA: Diagnosis not present

## 2022-08-29 DIAGNOSIS — W010XXA Fall on same level from slipping, tripping and stumbling without subsequent striking against object, initial encounter: Secondary | ICD-10-CM | POA: Diagnosis not present

## 2022-08-29 DIAGNOSIS — Z880 Allergy status to penicillin: Secondary | ICD-10-CM | POA: Diagnosis not present

## 2022-08-29 DIAGNOSIS — M1712 Unilateral primary osteoarthritis, left knee: Secondary | ICD-10-CM | POA: Diagnosis not present

## 2022-08-29 DIAGNOSIS — M5106 Intervertebral disc disorders with myelopathy, lumbar region: Secondary | ICD-10-CM | POA: Diagnosis not present

## 2022-08-29 DIAGNOSIS — M8580 Other specified disorders of bone density and structure, unspecified site: Secondary | ICD-10-CM | POA: Diagnosis not present

## 2022-08-30 DIAGNOSIS — Z7984 Long term (current) use of oral hypoglycemic drugs: Secondary | ICD-10-CM | POA: Diagnosis not present

## 2022-08-30 DIAGNOSIS — N390 Urinary tract infection, site not specified: Secondary | ICD-10-CM | POA: Diagnosis not present

## 2022-08-30 DIAGNOSIS — S72012A Unspecified intracapsular fracture of left femur, initial encounter for closed fracture: Secondary | ICD-10-CM | POA: Diagnosis not present

## 2022-08-30 DIAGNOSIS — E119 Type 2 diabetes mellitus without complications: Secondary | ICD-10-CM | POA: Diagnosis not present

## 2022-08-30 DIAGNOSIS — E785 Hyperlipidemia, unspecified: Secondary | ICD-10-CM | POA: Diagnosis not present

## 2022-08-30 DIAGNOSIS — E7849 Other hyperlipidemia: Secondary | ICD-10-CM | POA: Diagnosis not present

## 2022-08-30 DIAGNOSIS — W1830XA Fall on same level, unspecified, initial encounter: Secondary | ICD-10-CM | POA: Diagnosis not present

## 2022-08-30 DIAGNOSIS — S7292XA Unspecified fracture of left femur, initial encounter for closed fracture: Secondary | ICD-10-CM | POA: Diagnosis not present

## 2022-08-30 DIAGNOSIS — K219 Gastro-esophageal reflux disease without esophagitis: Secondary | ICD-10-CM | POA: Diagnosis not present

## 2022-08-30 DIAGNOSIS — I10 Essential (primary) hypertension: Secondary | ICD-10-CM | POA: Diagnosis not present

## 2022-08-30 DIAGNOSIS — S72002A Fracture of unspecified part of neck of left femur, initial encounter for closed fracture: Secondary | ICD-10-CM | POA: Diagnosis not present

## 2022-09-04 DIAGNOSIS — Z993 Dependence on wheelchair: Secondary | ICD-10-CM | POA: Diagnosis not present

## 2022-09-04 DIAGNOSIS — K219 Gastro-esophageal reflux disease without esophagitis: Secondary | ICD-10-CM | POA: Diagnosis not present

## 2022-09-04 DIAGNOSIS — Z7982 Long term (current) use of aspirin: Secondary | ICD-10-CM | POA: Diagnosis not present

## 2022-09-04 DIAGNOSIS — M1990 Unspecified osteoarthritis, unspecified site: Secondary | ICD-10-CM | POA: Diagnosis not present

## 2022-09-04 DIAGNOSIS — E119 Type 2 diabetes mellitus without complications: Secondary | ICD-10-CM | POA: Diagnosis not present

## 2022-09-04 DIAGNOSIS — Z791 Long term (current) use of non-steroidal anti-inflammatories (NSAID): Secondary | ICD-10-CM | POA: Diagnosis not present

## 2022-09-04 DIAGNOSIS — Z9181 History of falling: Secondary | ICD-10-CM | POA: Diagnosis not present

## 2022-09-04 DIAGNOSIS — Z7984 Long term (current) use of oral hypoglycemic drugs: Secondary | ICD-10-CM | POA: Diagnosis not present

## 2022-09-04 DIAGNOSIS — S72012D Unspecified intracapsular fracture of left femur, subsequent encounter for closed fracture with routine healing: Secondary | ICD-10-CM | POA: Diagnosis not present

## 2022-09-04 DIAGNOSIS — E7849 Other hyperlipidemia: Secondary | ICD-10-CM | POA: Diagnosis not present

## 2022-09-04 DIAGNOSIS — I10 Essential (primary) hypertension: Secondary | ICD-10-CM | POA: Diagnosis not present

## 2022-09-05 DIAGNOSIS — K219 Gastro-esophageal reflux disease without esophagitis: Secondary | ICD-10-CM | POA: Diagnosis not present

## 2022-09-05 DIAGNOSIS — E119 Type 2 diabetes mellitus without complications: Secondary | ICD-10-CM | POA: Diagnosis not present

## 2022-09-05 DIAGNOSIS — Z791 Long term (current) use of non-steroidal anti-inflammatories (NSAID): Secondary | ICD-10-CM | POA: Diagnosis not present

## 2022-09-05 DIAGNOSIS — Z993 Dependence on wheelchair: Secondary | ICD-10-CM | POA: Diagnosis not present

## 2022-09-05 DIAGNOSIS — M1990 Unspecified osteoarthritis, unspecified site: Secondary | ICD-10-CM | POA: Diagnosis not present

## 2022-09-05 DIAGNOSIS — E7849 Other hyperlipidemia: Secondary | ICD-10-CM | POA: Diagnosis not present

## 2022-09-05 DIAGNOSIS — Z9181 History of falling: Secondary | ICD-10-CM | POA: Diagnosis not present

## 2022-09-05 DIAGNOSIS — I10 Essential (primary) hypertension: Secondary | ICD-10-CM | POA: Diagnosis not present

## 2022-09-05 DIAGNOSIS — S72012D Unspecified intracapsular fracture of left femur, subsequent encounter for closed fracture with routine healing: Secondary | ICD-10-CM | POA: Diagnosis not present

## 2022-09-05 DIAGNOSIS — Z7982 Long term (current) use of aspirin: Secondary | ICD-10-CM | POA: Diagnosis not present

## 2022-09-05 DIAGNOSIS — Z7984 Long term (current) use of oral hypoglycemic drugs: Secondary | ICD-10-CM | POA: Diagnosis not present

## 2022-09-11 DIAGNOSIS — Z9181 History of falling: Secondary | ICD-10-CM | POA: Diagnosis not present

## 2022-09-11 DIAGNOSIS — K219 Gastro-esophageal reflux disease without esophagitis: Secondary | ICD-10-CM | POA: Diagnosis not present

## 2022-09-11 DIAGNOSIS — Z7984 Long term (current) use of oral hypoglycemic drugs: Secondary | ICD-10-CM | POA: Diagnosis not present

## 2022-09-11 DIAGNOSIS — M1990 Unspecified osteoarthritis, unspecified site: Secondary | ICD-10-CM | POA: Diagnosis not present

## 2022-09-11 DIAGNOSIS — Z7982 Long term (current) use of aspirin: Secondary | ICD-10-CM | POA: Diagnosis not present

## 2022-09-11 DIAGNOSIS — I10 Essential (primary) hypertension: Secondary | ICD-10-CM | POA: Diagnosis not present

## 2022-09-11 DIAGNOSIS — Z791 Long term (current) use of non-steroidal anti-inflammatories (NSAID): Secondary | ICD-10-CM | POA: Diagnosis not present

## 2022-09-11 DIAGNOSIS — E119 Type 2 diabetes mellitus without complications: Secondary | ICD-10-CM | POA: Diagnosis not present

## 2022-09-11 DIAGNOSIS — E7849 Other hyperlipidemia: Secondary | ICD-10-CM | POA: Diagnosis not present

## 2022-09-11 DIAGNOSIS — Z993 Dependence on wheelchair: Secondary | ICD-10-CM | POA: Diagnosis not present

## 2022-09-11 DIAGNOSIS — S72012D Unspecified intracapsular fracture of left femur, subsequent encounter for closed fracture with routine healing: Secondary | ICD-10-CM | POA: Diagnosis not present

## 2022-09-12 DIAGNOSIS — S72012A Unspecified intracapsular fracture of left femur, initial encounter for closed fracture: Secondary | ICD-10-CM | POA: Diagnosis not present

## 2022-09-12 DIAGNOSIS — Z4789 Encounter for other orthopedic aftercare: Secondary | ICD-10-CM | POA: Diagnosis not present

## 2022-09-16 DIAGNOSIS — Z993 Dependence on wheelchair: Secondary | ICD-10-CM | POA: Diagnosis not present

## 2022-09-16 DIAGNOSIS — S72012D Unspecified intracapsular fracture of left femur, subsequent encounter for closed fracture with routine healing: Secondary | ICD-10-CM | POA: Diagnosis not present

## 2022-09-16 DIAGNOSIS — K219 Gastro-esophageal reflux disease without esophagitis: Secondary | ICD-10-CM | POA: Diagnosis not present

## 2022-09-16 DIAGNOSIS — Z9181 History of falling: Secondary | ICD-10-CM | POA: Diagnosis not present

## 2022-09-16 DIAGNOSIS — Z7982 Long term (current) use of aspirin: Secondary | ICD-10-CM | POA: Diagnosis not present

## 2022-09-16 DIAGNOSIS — E7849 Other hyperlipidemia: Secondary | ICD-10-CM | POA: Diagnosis not present

## 2022-09-16 DIAGNOSIS — Z791 Long term (current) use of non-steroidal anti-inflammatories (NSAID): Secondary | ICD-10-CM | POA: Diagnosis not present

## 2022-09-16 DIAGNOSIS — I10 Essential (primary) hypertension: Secondary | ICD-10-CM | POA: Diagnosis not present

## 2022-09-16 DIAGNOSIS — E119 Type 2 diabetes mellitus without complications: Secondary | ICD-10-CM | POA: Diagnosis not present

## 2022-09-16 DIAGNOSIS — Z7984 Long term (current) use of oral hypoglycemic drugs: Secondary | ICD-10-CM | POA: Diagnosis not present

## 2022-09-16 DIAGNOSIS — M1990 Unspecified osteoarthritis, unspecified site: Secondary | ICD-10-CM | POA: Diagnosis not present

## 2022-09-18 DIAGNOSIS — E119 Type 2 diabetes mellitus without complications: Secondary | ICD-10-CM | POA: Diagnosis not present

## 2022-09-18 DIAGNOSIS — Z791 Long term (current) use of non-steroidal anti-inflammatories (NSAID): Secondary | ICD-10-CM | POA: Diagnosis not present

## 2022-09-18 DIAGNOSIS — Z993 Dependence on wheelchair: Secondary | ICD-10-CM | POA: Diagnosis not present

## 2022-09-18 DIAGNOSIS — Z7982 Long term (current) use of aspirin: Secondary | ICD-10-CM | POA: Diagnosis not present

## 2022-09-18 DIAGNOSIS — S72012D Unspecified intracapsular fracture of left femur, subsequent encounter for closed fracture with routine healing: Secondary | ICD-10-CM | POA: Diagnosis not present

## 2022-09-18 DIAGNOSIS — E7849 Other hyperlipidemia: Secondary | ICD-10-CM | POA: Diagnosis not present

## 2022-09-18 DIAGNOSIS — I10 Essential (primary) hypertension: Secondary | ICD-10-CM | POA: Diagnosis not present

## 2022-09-18 DIAGNOSIS — Z7984 Long term (current) use of oral hypoglycemic drugs: Secondary | ICD-10-CM | POA: Diagnosis not present

## 2022-09-18 DIAGNOSIS — M1990 Unspecified osteoarthritis, unspecified site: Secondary | ICD-10-CM | POA: Diagnosis not present

## 2022-09-18 DIAGNOSIS — K219 Gastro-esophageal reflux disease without esophagitis: Secondary | ICD-10-CM | POA: Diagnosis not present

## 2022-09-18 DIAGNOSIS — Z9181 History of falling: Secondary | ICD-10-CM | POA: Diagnosis not present

## 2022-09-22 DIAGNOSIS — E7849 Other hyperlipidemia: Secondary | ICD-10-CM | POA: Diagnosis not present

## 2022-09-22 DIAGNOSIS — S72012D Unspecified intracapsular fracture of left femur, subsequent encounter for closed fracture with routine healing: Secondary | ICD-10-CM | POA: Diagnosis not present

## 2022-09-22 DIAGNOSIS — E119 Type 2 diabetes mellitus without complications: Secondary | ICD-10-CM | POA: Diagnosis not present

## 2022-09-22 DIAGNOSIS — Z7982 Long term (current) use of aspirin: Secondary | ICD-10-CM | POA: Diagnosis not present

## 2022-09-22 DIAGNOSIS — Z7984 Long term (current) use of oral hypoglycemic drugs: Secondary | ICD-10-CM | POA: Diagnosis not present

## 2022-09-22 DIAGNOSIS — Z993 Dependence on wheelchair: Secondary | ICD-10-CM | POA: Diagnosis not present

## 2022-09-22 DIAGNOSIS — Z791 Long term (current) use of non-steroidal anti-inflammatories (NSAID): Secondary | ICD-10-CM | POA: Diagnosis not present

## 2022-09-22 DIAGNOSIS — K219 Gastro-esophageal reflux disease without esophagitis: Secondary | ICD-10-CM | POA: Diagnosis not present

## 2022-09-22 DIAGNOSIS — Z9181 History of falling: Secondary | ICD-10-CM | POA: Diagnosis not present

## 2022-09-22 DIAGNOSIS — I10 Essential (primary) hypertension: Secondary | ICD-10-CM | POA: Diagnosis not present

## 2022-09-22 DIAGNOSIS — M1990 Unspecified osteoarthritis, unspecified site: Secondary | ICD-10-CM | POA: Diagnosis not present

## 2022-09-25 DIAGNOSIS — Z791 Long term (current) use of non-steroidal anti-inflammatories (NSAID): Secondary | ICD-10-CM | POA: Diagnosis not present

## 2022-09-25 DIAGNOSIS — I10 Essential (primary) hypertension: Secondary | ICD-10-CM | POA: Diagnosis not present

## 2022-09-25 DIAGNOSIS — E119 Type 2 diabetes mellitus without complications: Secondary | ICD-10-CM | POA: Diagnosis not present

## 2022-09-25 DIAGNOSIS — E7849 Other hyperlipidemia: Secondary | ICD-10-CM | POA: Diagnosis not present

## 2022-09-25 DIAGNOSIS — K219 Gastro-esophageal reflux disease without esophagitis: Secondary | ICD-10-CM | POA: Diagnosis not present

## 2022-09-25 DIAGNOSIS — E559 Vitamin D deficiency, unspecified: Secondary | ICD-10-CM | POA: Diagnosis not present

## 2022-09-25 DIAGNOSIS — Z7982 Long term (current) use of aspirin: Secondary | ICD-10-CM | POA: Diagnosis not present

## 2022-09-25 DIAGNOSIS — E1169 Type 2 diabetes mellitus with other specified complication: Secondary | ICD-10-CM | POA: Diagnosis not present

## 2022-09-25 DIAGNOSIS — S72012D Unspecified intracapsular fracture of left femur, subsequent encounter for closed fracture with routine healing: Secondary | ICD-10-CM | POA: Diagnosis not present

## 2022-09-25 DIAGNOSIS — M1990 Unspecified osteoarthritis, unspecified site: Secondary | ICD-10-CM | POA: Diagnosis not present

## 2022-09-25 DIAGNOSIS — Z7984 Long term (current) use of oral hypoglycemic drugs: Secondary | ICD-10-CM | POA: Diagnosis not present

## 2022-09-25 DIAGNOSIS — Z9181 History of falling: Secondary | ICD-10-CM | POA: Diagnosis not present

## 2022-09-25 DIAGNOSIS — Z993 Dependence on wheelchair: Secondary | ICD-10-CM | POA: Diagnosis not present

## 2022-10-01 DIAGNOSIS — Z7984 Long term (current) use of oral hypoglycemic drugs: Secondary | ICD-10-CM | POA: Diagnosis not present

## 2022-10-01 DIAGNOSIS — E119 Type 2 diabetes mellitus without complications: Secondary | ICD-10-CM | POA: Diagnosis not present

## 2022-10-01 DIAGNOSIS — M1990 Unspecified osteoarthritis, unspecified site: Secondary | ICD-10-CM | POA: Diagnosis not present

## 2022-10-01 DIAGNOSIS — Z9181 History of falling: Secondary | ICD-10-CM | POA: Diagnosis not present

## 2022-10-01 DIAGNOSIS — Z7982 Long term (current) use of aspirin: Secondary | ICD-10-CM | POA: Diagnosis not present

## 2022-10-01 DIAGNOSIS — Z791 Long term (current) use of non-steroidal anti-inflammatories (NSAID): Secondary | ICD-10-CM | POA: Diagnosis not present

## 2022-10-01 DIAGNOSIS — E7849 Other hyperlipidemia: Secondary | ICD-10-CM | POA: Diagnosis not present

## 2022-10-01 DIAGNOSIS — I10 Essential (primary) hypertension: Secondary | ICD-10-CM | POA: Diagnosis not present

## 2022-10-01 DIAGNOSIS — Z993 Dependence on wheelchair: Secondary | ICD-10-CM | POA: Diagnosis not present

## 2022-10-01 DIAGNOSIS — S72012D Unspecified intracapsular fracture of left femur, subsequent encounter for closed fracture with routine healing: Secondary | ICD-10-CM | POA: Diagnosis not present

## 2022-10-01 DIAGNOSIS — K219 Gastro-esophageal reflux disease without esophagitis: Secondary | ICD-10-CM | POA: Diagnosis not present

## 2022-10-04 DIAGNOSIS — K219 Gastro-esophageal reflux disease without esophagitis: Secondary | ICD-10-CM | POA: Diagnosis not present

## 2022-10-04 DIAGNOSIS — M1990 Unspecified osteoarthritis, unspecified site: Secondary | ICD-10-CM | POA: Diagnosis not present

## 2022-10-04 DIAGNOSIS — Z791 Long term (current) use of non-steroidal anti-inflammatories (NSAID): Secondary | ICD-10-CM | POA: Diagnosis not present

## 2022-10-04 DIAGNOSIS — Z9181 History of falling: Secondary | ICD-10-CM | POA: Diagnosis not present

## 2022-10-04 DIAGNOSIS — S72012D Unspecified intracapsular fracture of left femur, subsequent encounter for closed fracture with routine healing: Secondary | ICD-10-CM | POA: Diagnosis not present

## 2022-10-04 DIAGNOSIS — Z7984 Long term (current) use of oral hypoglycemic drugs: Secondary | ICD-10-CM | POA: Diagnosis not present

## 2022-10-04 DIAGNOSIS — E119 Type 2 diabetes mellitus without complications: Secondary | ICD-10-CM | POA: Diagnosis not present

## 2022-10-04 DIAGNOSIS — I10 Essential (primary) hypertension: Secondary | ICD-10-CM | POA: Diagnosis not present

## 2022-10-04 DIAGNOSIS — E7849 Other hyperlipidemia: Secondary | ICD-10-CM | POA: Diagnosis not present

## 2022-10-04 DIAGNOSIS — Z7982 Long term (current) use of aspirin: Secondary | ICD-10-CM | POA: Diagnosis not present

## 2022-10-06 DIAGNOSIS — M5116 Intervertebral disc disorders with radiculopathy, lumbar region: Secondary | ICD-10-CM | POA: Diagnosis not present

## 2022-10-06 DIAGNOSIS — E1169 Type 2 diabetes mellitus with other specified complication: Secondary | ICD-10-CM | POA: Diagnosis not present

## 2022-10-06 DIAGNOSIS — E7801 Familial hypercholesterolemia: Secondary | ICD-10-CM | POA: Diagnosis not present

## 2022-10-06 DIAGNOSIS — S72002A Fracture of unspecified part of neck of left femur, initial encounter for closed fracture: Secondary | ICD-10-CM | POA: Diagnosis not present

## 2022-10-08 DIAGNOSIS — E119 Type 2 diabetes mellitus without complications: Secondary | ICD-10-CM | POA: Diagnosis not present

## 2022-10-08 DIAGNOSIS — K219 Gastro-esophageal reflux disease without esophagitis: Secondary | ICD-10-CM | POA: Diagnosis not present

## 2022-10-08 DIAGNOSIS — Z791 Long term (current) use of non-steroidal anti-inflammatories (NSAID): Secondary | ICD-10-CM | POA: Diagnosis not present

## 2022-10-08 DIAGNOSIS — E7849 Other hyperlipidemia: Secondary | ICD-10-CM | POA: Diagnosis not present

## 2022-10-08 DIAGNOSIS — Z7982 Long term (current) use of aspirin: Secondary | ICD-10-CM | POA: Diagnosis not present

## 2022-10-08 DIAGNOSIS — M1990 Unspecified osteoarthritis, unspecified site: Secondary | ICD-10-CM | POA: Diagnosis not present

## 2022-10-08 DIAGNOSIS — I10 Essential (primary) hypertension: Secondary | ICD-10-CM | POA: Diagnosis not present

## 2022-10-08 DIAGNOSIS — Z9181 History of falling: Secondary | ICD-10-CM | POA: Diagnosis not present

## 2022-10-08 DIAGNOSIS — Z993 Dependence on wheelchair: Secondary | ICD-10-CM | POA: Diagnosis not present

## 2022-10-08 DIAGNOSIS — S72012D Unspecified intracapsular fracture of left femur, subsequent encounter for closed fracture with routine healing: Secondary | ICD-10-CM | POA: Diagnosis not present

## 2022-10-08 DIAGNOSIS — Z7984 Long term (current) use of oral hypoglycemic drugs: Secondary | ICD-10-CM | POA: Diagnosis not present

## 2022-10-10 DIAGNOSIS — S72012D Unspecified intracapsular fracture of left femur, subsequent encounter for closed fracture with routine healing: Secondary | ICD-10-CM | POA: Diagnosis not present

## 2022-10-10 DIAGNOSIS — S72012A Unspecified intracapsular fracture of left femur, initial encounter for closed fracture: Secondary | ICD-10-CM | POA: Diagnosis not present

## 2022-10-15 DIAGNOSIS — S72012D Unspecified intracapsular fracture of left femur, subsequent encounter for closed fracture with routine healing: Secondary | ICD-10-CM | POA: Diagnosis not present

## 2022-10-15 DIAGNOSIS — E7849 Other hyperlipidemia: Secondary | ICD-10-CM | POA: Diagnosis not present

## 2022-10-15 DIAGNOSIS — Z9181 History of falling: Secondary | ICD-10-CM | POA: Diagnosis not present

## 2022-10-15 DIAGNOSIS — M1990 Unspecified osteoarthritis, unspecified site: Secondary | ICD-10-CM | POA: Diagnosis not present

## 2022-10-15 DIAGNOSIS — E119 Type 2 diabetes mellitus without complications: Secondary | ICD-10-CM | POA: Diagnosis not present

## 2022-10-15 DIAGNOSIS — Z791 Long term (current) use of non-steroidal anti-inflammatories (NSAID): Secondary | ICD-10-CM | POA: Diagnosis not present

## 2022-10-15 DIAGNOSIS — K219 Gastro-esophageal reflux disease without esophagitis: Secondary | ICD-10-CM | POA: Diagnosis not present

## 2022-10-15 DIAGNOSIS — Z993 Dependence on wheelchair: Secondary | ICD-10-CM | POA: Diagnosis not present

## 2022-10-15 DIAGNOSIS — I10 Essential (primary) hypertension: Secondary | ICD-10-CM | POA: Diagnosis not present

## 2022-10-15 DIAGNOSIS — Z7982 Long term (current) use of aspirin: Secondary | ICD-10-CM | POA: Diagnosis not present

## 2022-10-15 DIAGNOSIS — Z7984 Long term (current) use of oral hypoglycemic drugs: Secondary | ICD-10-CM | POA: Diagnosis not present

## 2022-10-22 DIAGNOSIS — Z993 Dependence on wheelchair: Secondary | ICD-10-CM | POA: Diagnosis not present

## 2022-10-22 DIAGNOSIS — Z791 Long term (current) use of non-steroidal anti-inflammatories (NSAID): Secondary | ICD-10-CM | POA: Diagnosis not present

## 2022-10-22 DIAGNOSIS — M1990 Unspecified osteoarthritis, unspecified site: Secondary | ICD-10-CM | POA: Diagnosis not present

## 2022-10-22 DIAGNOSIS — Z9181 History of falling: Secondary | ICD-10-CM | POA: Diagnosis not present

## 2022-10-22 DIAGNOSIS — E119 Type 2 diabetes mellitus without complications: Secondary | ICD-10-CM | POA: Diagnosis not present

## 2022-10-22 DIAGNOSIS — S72012D Unspecified intracapsular fracture of left femur, subsequent encounter for closed fracture with routine healing: Secondary | ICD-10-CM | POA: Diagnosis not present

## 2022-10-22 DIAGNOSIS — K219 Gastro-esophageal reflux disease without esophagitis: Secondary | ICD-10-CM | POA: Diagnosis not present

## 2022-10-22 DIAGNOSIS — I10 Essential (primary) hypertension: Secondary | ICD-10-CM | POA: Diagnosis not present

## 2022-10-22 DIAGNOSIS — Z7984 Long term (current) use of oral hypoglycemic drugs: Secondary | ICD-10-CM | POA: Diagnosis not present

## 2022-10-22 DIAGNOSIS — Z7982 Long term (current) use of aspirin: Secondary | ICD-10-CM | POA: Diagnosis not present

## 2022-10-22 DIAGNOSIS — E7849 Other hyperlipidemia: Secondary | ICD-10-CM | POA: Diagnosis not present

## 2022-10-31 DIAGNOSIS — S72012D Unspecified intracapsular fracture of left femur, subsequent encounter for closed fracture with routine healing: Secondary | ICD-10-CM | POA: Diagnosis not present

## 2022-10-31 DIAGNOSIS — M1990 Unspecified osteoarthritis, unspecified site: Secondary | ICD-10-CM | POA: Diagnosis not present

## 2022-10-31 DIAGNOSIS — Z9181 History of falling: Secondary | ICD-10-CM | POA: Diagnosis not present

## 2022-10-31 DIAGNOSIS — E119 Type 2 diabetes mellitus without complications: Secondary | ICD-10-CM | POA: Diagnosis not present

## 2022-10-31 DIAGNOSIS — E7849 Other hyperlipidemia: Secondary | ICD-10-CM | POA: Diagnosis not present

## 2022-10-31 DIAGNOSIS — Z7984 Long term (current) use of oral hypoglycemic drugs: Secondary | ICD-10-CM | POA: Diagnosis not present

## 2022-10-31 DIAGNOSIS — K219 Gastro-esophageal reflux disease without esophagitis: Secondary | ICD-10-CM | POA: Diagnosis not present

## 2022-10-31 DIAGNOSIS — Z7982 Long term (current) use of aspirin: Secondary | ICD-10-CM | POA: Diagnosis not present

## 2022-10-31 DIAGNOSIS — Z993 Dependence on wheelchair: Secondary | ICD-10-CM | POA: Diagnosis not present

## 2022-10-31 DIAGNOSIS — Z791 Long term (current) use of non-steroidal anti-inflammatories (NSAID): Secondary | ICD-10-CM | POA: Diagnosis not present

## 2022-10-31 DIAGNOSIS — I10 Essential (primary) hypertension: Secondary | ICD-10-CM | POA: Diagnosis not present

## 2022-11-12 DIAGNOSIS — D485 Neoplasm of uncertain behavior of skin: Secondary | ICD-10-CM | POA: Diagnosis not present

## 2022-11-12 DIAGNOSIS — L570 Actinic keratosis: Secondary | ICD-10-CM | POA: Diagnosis not present

## 2023-01-15 DIAGNOSIS — E119 Type 2 diabetes mellitus without complications: Secondary | ICD-10-CM | POA: Diagnosis not present

## 2023-02-02 DIAGNOSIS — I10 Essential (primary) hypertension: Secondary | ICD-10-CM | POA: Diagnosis not present

## 2023-02-15 DIAGNOSIS — E119 Type 2 diabetes mellitus without complications: Secondary | ICD-10-CM | POA: Diagnosis not present

## 2023-03-17 DIAGNOSIS — E119 Type 2 diabetes mellitus without complications: Secondary | ICD-10-CM | POA: Diagnosis not present

## 2023-04-02 DIAGNOSIS — Z1329 Encounter for screening for other suspected endocrine disorder: Secondary | ICD-10-CM | POA: Diagnosis not present

## 2023-04-02 DIAGNOSIS — E782 Mixed hyperlipidemia: Secondary | ICD-10-CM | POA: Diagnosis not present

## 2023-04-02 DIAGNOSIS — E1169 Type 2 diabetes mellitus with other specified complication: Secondary | ICD-10-CM | POA: Diagnosis not present

## 2023-04-02 DIAGNOSIS — I10 Essential (primary) hypertension: Secondary | ICD-10-CM | POA: Diagnosis not present

## 2023-04-02 DIAGNOSIS — K219 Gastro-esophageal reflux disease without esophagitis: Secondary | ICD-10-CM | POA: Diagnosis not present

## 2023-04-02 DIAGNOSIS — E7849 Other hyperlipidemia: Secondary | ICD-10-CM | POA: Diagnosis not present

## 2023-04-02 DIAGNOSIS — Z0000 Encounter for general adult medical examination without abnormal findings: Secondary | ICD-10-CM | POA: Diagnosis not present

## 2023-04-09 DIAGNOSIS — M4306 Spondylolysis, lumbar region: Secondary | ICD-10-CM | POA: Diagnosis not present

## 2023-04-09 DIAGNOSIS — Z23 Encounter for immunization: Secondary | ICD-10-CM | POA: Diagnosis not present

## 2023-04-09 DIAGNOSIS — M5116 Intervertebral disc disorders with radiculopathy, lumbar region: Secondary | ICD-10-CM | POA: Diagnosis not present

## 2023-04-09 DIAGNOSIS — E1169 Type 2 diabetes mellitus with other specified complication: Secondary | ICD-10-CM | POA: Diagnosis not present

## 2023-04-09 DIAGNOSIS — Z0001 Encounter for general adult medical examination with abnormal findings: Secondary | ICD-10-CM | POA: Diagnosis not present

## 2023-04-09 DIAGNOSIS — K219 Gastro-esophageal reflux disease without esophagitis: Secondary | ICD-10-CM | POA: Diagnosis not present

## 2023-04-17 DIAGNOSIS — E119 Type 2 diabetes mellitus without complications: Secondary | ICD-10-CM | POA: Diagnosis not present

## 2023-05-17 DIAGNOSIS — E119 Type 2 diabetes mellitus without complications: Secondary | ICD-10-CM | POA: Diagnosis not present

## 2023-05-20 DIAGNOSIS — L814 Other melanin hyperpigmentation: Secondary | ICD-10-CM | POA: Diagnosis not present

## 2023-05-20 DIAGNOSIS — L570 Actinic keratosis: Secondary | ICD-10-CM | POA: Diagnosis not present

## 2023-06-17 DIAGNOSIS — E119 Type 2 diabetes mellitus without complications: Secondary | ICD-10-CM | POA: Diagnosis not present

## 2023-07-18 DIAGNOSIS — E119 Type 2 diabetes mellitus without complications: Secondary | ICD-10-CM | POA: Diagnosis not present

## 2023-07-22 DIAGNOSIS — Z1231 Encounter for screening mammogram for malignant neoplasm of breast: Secondary | ICD-10-CM | POA: Diagnosis not present

## 2023-08-15 DIAGNOSIS — E119 Type 2 diabetes mellitus without complications: Secondary | ICD-10-CM | POA: Diagnosis not present

## 2023-08-29 DIAGNOSIS — Z20828 Contact with and (suspected) exposure to other viral communicable diseases: Secondary | ICD-10-CM | POA: Diagnosis not present

## 2023-08-29 DIAGNOSIS — Z6830 Body mass index (BMI) 30.0-30.9, adult: Secondary | ICD-10-CM | POA: Diagnosis not present

## 2023-08-29 DIAGNOSIS — J101 Influenza due to other identified influenza virus with other respiratory manifestations: Secondary | ICD-10-CM | POA: Diagnosis not present

## 2023-08-29 DIAGNOSIS — R509 Fever, unspecified: Secondary | ICD-10-CM | POA: Diagnosis not present

## 2023-09-15 DIAGNOSIS — E119 Type 2 diabetes mellitus without complications: Secondary | ICD-10-CM | POA: Diagnosis not present

## 2023-10-01 DIAGNOSIS — E1169 Type 2 diabetes mellitus with other specified complication: Secondary | ICD-10-CM | POA: Diagnosis not present

## 2023-10-01 DIAGNOSIS — E7849 Other hyperlipidemia: Secondary | ICD-10-CM | POA: Diagnosis not present

## 2023-10-09 DIAGNOSIS — I10 Essential (primary) hypertension: Secondary | ICD-10-CM | POA: Diagnosis not present

## 2023-10-09 DIAGNOSIS — Z6830 Body mass index (BMI) 30.0-30.9, adult: Secondary | ICD-10-CM | POA: Diagnosis not present

## 2023-10-09 DIAGNOSIS — M8580 Other specified disorders of bone density and structure, unspecified site: Secondary | ICD-10-CM | POA: Diagnosis not present

## 2023-10-09 DIAGNOSIS — E782 Mixed hyperlipidemia: Secondary | ICD-10-CM | POA: Diagnosis not present

## 2023-10-09 DIAGNOSIS — E1169 Type 2 diabetes mellitus with other specified complication: Secondary | ICD-10-CM | POA: Diagnosis not present

## 2023-10-15 DIAGNOSIS — E119 Type 2 diabetes mellitus without complications: Secondary | ICD-10-CM | POA: Diagnosis not present

## 2023-11-15 DIAGNOSIS — E119 Type 2 diabetes mellitus without complications: Secondary | ICD-10-CM | POA: Diagnosis not present

## 2023-11-17 DIAGNOSIS — L570 Actinic keratosis: Secondary | ICD-10-CM | POA: Diagnosis not present

## 2023-11-17 DIAGNOSIS — L821 Other seborrheic keratosis: Secondary | ICD-10-CM | POA: Diagnosis not present

## 2023-12-15 DIAGNOSIS — E119 Type 2 diabetes mellitus without complications: Secondary | ICD-10-CM | POA: Diagnosis not present

## 2024-01-15 DIAGNOSIS — E119 Type 2 diabetes mellitus without complications: Secondary | ICD-10-CM | POA: Diagnosis not present

## 2024-02-15 DIAGNOSIS — E119 Type 2 diabetes mellitus without complications: Secondary | ICD-10-CM | POA: Diagnosis not present

## 2024-03-16 DIAGNOSIS — E119 Type 2 diabetes mellitus without complications: Secondary | ICD-10-CM | POA: Diagnosis not present

## 2024-03-31 DIAGNOSIS — W5501XA Bitten by cat, initial encounter: Secondary | ICD-10-CM | POA: Diagnosis not present

## 2024-03-31 DIAGNOSIS — Z6830 Body mass index (BMI) 30.0-30.9, adult: Secondary | ICD-10-CM | POA: Diagnosis not present

## 2024-03-31 DIAGNOSIS — S51851A Open bite of right forearm, initial encounter: Secondary | ICD-10-CM | POA: Diagnosis not present

## 2024-04-01 DIAGNOSIS — S61551A Open bite of right wrist, initial encounter: Secondary | ICD-10-CM | POA: Diagnosis not present

## 2024-04-01 DIAGNOSIS — K219 Gastro-esophageal reflux disease without esophagitis: Secondary | ICD-10-CM | POA: Diagnosis not present

## 2024-04-01 DIAGNOSIS — W5501XA Bitten by cat, initial encounter: Secondary | ICD-10-CM | POA: Diagnosis not present

## 2024-04-01 DIAGNOSIS — E119 Type 2 diabetes mellitus without complications: Secondary | ICD-10-CM | POA: Diagnosis not present

## 2024-04-01 DIAGNOSIS — Z7984 Long term (current) use of oral hypoglycemic drugs: Secondary | ICD-10-CM | POA: Diagnosis not present

## 2024-04-01 DIAGNOSIS — Z880 Allergy status to penicillin: Secondary | ICD-10-CM | POA: Diagnosis not present

## 2024-04-01 DIAGNOSIS — E7800 Pure hypercholesterolemia, unspecified: Secondary | ICD-10-CM | POA: Diagnosis not present

## 2024-04-01 DIAGNOSIS — Z79899 Other long term (current) drug therapy: Secondary | ICD-10-CM | POA: Diagnosis not present

## 2024-04-01 DIAGNOSIS — Z203 Contact with and (suspected) exposure to rabies: Secondary | ICD-10-CM | POA: Diagnosis not present

## 2024-04-01 DIAGNOSIS — I10 Essential (primary) hypertension: Secondary | ICD-10-CM | POA: Diagnosis not present

## 2024-04-01 DIAGNOSIS — Z23 Encounter for immunization: Secondary | ICD-10-CM | POA: Diagnosis not present

## 2024-04-01 DIAGNOSIS — Z2914 Encounter for prophylactic rabies immune globin: Secondary | ICD-10-CM | POA: Diagnosis not present

## 2024-04-04 DIAGNOSIS — E559 Vitamin D deficiency, unspecified: Secondary | ICD-10-CM | POA: Diagnosis not present

## 2024-04-04 DIAGNOSIS — R5382 Chronic fatigue, unspecified: Secondary | ICD-10-CM | POA: Diagnosis not present

## 2024-04-04 DIAGNOSIS — E7849 Other hyperlipidemia: Secondary | ICD-10-CM | POA: Diagnosis not present

## 2024-04-04 DIAGNOSIS — E1169 Type 2 diabetes mellitus with other specified complication: Secondary | ICD-10-CM | POA: Diagnosis not present

## 2024-04-04 DIAGNOSIS — Z203 Contact with and (suspected) exposure to rabies: Secondary | ICD-10-CM | POA: Diagnosis not present

## 2024-04-04 DIAGNOSIS — D519 Vitamin B12 deficiency anemia, unspecified: Secondary | ICD-10-CM | POA: Diagnosis not present

## 2024-04-04 DIAGNOSIS — Z1329 Encounter for screening for other suspected endocrine disorder: Secondary | ICD-10-CM | POA: Diagnosis not present

## 2024-04-08 DIAGNOSIS — Z203 Contact with and (suspected) exposure to rabies: Secondary | ICD-10-CM | POA: Diagnosis not present

## 2024-04-11 DIAGNOSIS — Z1331 Encounter for screening for depression: Secondary | ICD-10-CM | POA: Diagnosis not present

## 2024-04-11 DIAGNOSIS — I10 Essential (primary) hypertension: Secondary | ICD-10-CM | POA: Diagnosis not present

## 2024-04-11 DIAGNOSIS — Z23 Encounter for immunization: Secondary | ICD-10-CM | POA: Diagnosis not present

## 2024-04-11 DIAGNOSIS — Z1389 Encounter for screening for other disorder: Secondary | ICD-10-CM | POA: Diagnosis not present

## 2024-04-11 DIAGNOSIS — Z0001 Encounter for general adult medical examination with abnormal findings: Secondary | ICD-10-CM | POA: Diagnosis not present

## 2024-04-11 DIAGNOSIS — Z6829 Body mass index (BMI) 29.0-29.9, adult: Secondary | ICD-10-CM | POA: Diagnosis not present

## 2024-04-11 DIAGNOSIS — S51851A Open bite of right forearm, initial encounter: Secondary | ICD-10-CM | POA: Diagnosis not present

## 2024-04-11 DIAGNOSIS — E1169 Type 2 diabetes mellitus with other specified complication: Secondary | ICD-10-CM | POA: Diagnosis not present

## 2024-04-11 DIAGNOSIS — E559 Vitamin D deficiency, unspecified: Secondary | ICD-10-CM | POA: Diagnosis not present

## 2024-04-15 DIAGNOSIS — Z203 Contact with and (suspected) exposure to rabies: Secondary | ICD-10-CM | POA: Diagnosis not present

## 2024-04-16 DIAGNOSIS — E119 Type 2 diabetes mellitus without complications: Secondary | ICD-10-CM | POA: Diagnosis not present

## 2024-05-02 DIAGNOSIS — Z1211 Encounter for screening for malignant neoplasm of colon: Secondary | ICD-10-CM | POA: Diagnosis not present

## 2024-05-25 DIAGNOSIS — L573 Poikiloderma of Civatte: Secondary | ICD-10-CM | POA: Diagnosis not present
